# Patient Record
Sex: Female | Born: 1935 | Race: White | Hispanic: No | Marital: Married | Smoking: Never smoker
Health system: Southern US, Community
[De-identification: ages and names within clinical notes are randomized; demographics above are authoritative.]

## PROBLEM LIST (undated history)

## (undated) DIAGNOSIS — K219 Gastro-esophageal reflux disease without esophagitis: Secondary | ICD-10-CM

## (undated) DIAGNOSIS — C50919 Malignant neoplasm of unspecified site of unspecified female breast: Secondary | ICD-10-CM

## (undated) DIAGNOSIS — M1990 Unspecified osteoarthritis, unspecified site: Secondary | ICD-10-CM

## (undated) DIAGNOSIS — I10 Essential (primary) hypertension: Secondary | ICD-10-CM

## (undated) DIAGNOSIS — F419 Anxiety disorder, unspecified: Secondary | ICD-10-CM

## (undated) DIAGNOSIS — I639 Cerebral infarction, unspecified: Secondary | ICD-10-CM

## (undated) HISTORY — PX: APPENDECTOMY: SHX54

## (undated) HISTORY — DX: Cerebral infarction, unspecified: I63.9

## (undated) HISTORY — PX: BREAST SURGERY: SHX581

---

## 1988-01-19 HISTORY — PX: ANKLE FRACTURE SURGERY: SHX122

## 2004-01-19 DIAGNOSIS — C50919 Malignant neoplasm of unspecified site of unspecified female breast: Secondary | ICD-10-CM

## 2004-01-19 HISTORY — DX: Malignant neoplasm of unspecified site of unspecified female breast: C50.919

## 2004-04-18 HISTORY — PX: OTHER SURGICAL HISTORY: SHX169

## 2004-04-27 ENCOUNTER — Encounter (INDEPENDENT_AMBULATORY_CARE_PROVIDER_SITE_OTHER): Payer: Self-pay | Admitting: *Deleted

## 2004-04-27 ENCOUNTER — Encounter: Admission: RE | Admit: 2004-04-27 | Discharge: 2004-04-27 | Payer: Self-pay | Admitting: General Surgery

## 2004-04-27 ENCOUNTER — Encounter (INDEPENDENT_AMBULATORY_CARE_PROVIDER_SITE_OTHER): Payer: Self-pay | Admitting: Radiology

## 2004-05-01 ENCOUNTER — Encounter: Admission: RE | Admit: 2004-05-01 | Discharge: 2004-05-01 | Payer: Self-pay | Admitting: General Surgery

## 2004-05-05 ENCOUNTER — Encounter: Admission: RE | Admit: 2004-05-05 | Discharge: 2004-05-05 | Payer: Self-pay | Admitting: General Surgery

## 2004-05-07 ENCOUNTER — Encounter (INDEPENDENT_AMBULATORY_CARE_PROVIDER_SITE_OTHER): Payer: Self-pay | Admitting: *Deleted

## 2004-05-07 ENCOUNTER — Ambulatory Visit: Payer: Self-pay | Admitting: Oncology

## 2004-05-07 ENCOUNTER — Ambulatory Visit (HOSPITAL_COMMUNITY): Admission: RE | Admit: 2004-05-07 | Discharge: 2004-05-07 | Payer: Self-pay | Admitting: General Surgery

## 2004-05-07 ENCOUNTER — Ambulatory Visit (HOSPITAL_BASED_OUTPATIENT_CLINIC_OR_DEPARTMENT_OTHER): Admission: RE | Admit: 2004-05-07 | Discharge: 2004-05-07 | Payer: Self-pay | Admitting: General Surgery

## 2004-10-12 ENCOUNTER — Encounter: Admission: RE | Admit: 2004-10-12 | Discharge: 2004-10-12 | Payer: Self-pay | Admitting: General Surgery

## 2005-06-03 ENCOUNTER — Encounter: Admission: RE | Admit: 2005-06-03 | Discharge: 2005-06-03 | Payer: Self-pay | Admitting: General Surgery

## 2006-06-16 ENCOUNTER — Encounter: Admission: RE | Admit: 2006-06-16 | Discharge: 2006-06-16 | Payer: Self-pay | Admitting: General Surgery

## 2006-08-20 ENCOUNTER — Emergency Department (HOSPITAL_COMMUNITY): Admission: EM | Admit: 2006-08-20 | Discharge: 2006-08-20 | Payer: Self-pay | Admitting: Emergency Medicine

## 2007-06-26 ENCOUNTER — Encounter: Admission: RE | Admit: 2007-06-26 | Discharge: 2007-06-26 | Payer: Self-pay | Admitting: Surgery

## 2010-06-05 NOTE — Op Note (Signed)
NAME:  Erin Branch, Erin Branch                 ACCOUNT NO.:  [id number]   MEDICAL RECORD NO.:  [id number]          PATIENT TYPE:  AMB   LOCATION:  DSC                          FACILITY:  [facility]   PHYSICIAN:  Rose Phi. Maple Hudson, M.D.   DATE OF BIRTH:  08-30-1933   DATE OF PROCEDURE:  05/07/2004  DATE OF DISCHARGE:                                 OPERATIVE REPORT   PREOPERATIVE DIAGNOSIS:  Stage I carcinoma of the right breast.   POSTOPERATIVE DIAGNOSIS:  Stage I carcinoma of the right breast.   OPERATION:  1.  Blue dye injection.  2.  Right sentinel lymph node biopsy  3.  Right partial mastectomy.   SURGEON:  Francina Ames, M.D.   ANESTHESIA:  General.   OPERATIVE PROCEDURE:  Prior to coming to the operating room, 1 mCi of  technetium sulfur colloid was injected intradermally.   After suitable general anesthesia was induced, the patient was placed in  supine position with the arms extended on the arm boards and the right  breast and axilla prepped and draped in usual fashion.  Five milliliters of  a mixture of 2 mL of methylene blue and 3 mL of injectable saline were  injected into the subareolar breast tissue and then gently massaged.   After prepping and draping, a short transverse right axillary incision was  made with dissection through subcutaneous tissue to the clavipectoral  fascia.  Just deep to this fascia was a blue lymphatic clearly going into a  hot and blue lymph node, which we removed as the sentinel node.  There was 1  adjacent node that was enlarged, but probably with fat replacement.  There  were no other blue, hot or palpable nodes.   While that node was being evaluated by the pathologist, a curved incision  overlying the palpable tumor in the upper-outer quadrant of the right breast  was outlined and then an incision made including an ellipse of skin.  We  then did a wide excision of the palpable mass.  Specimen was oriented for  the pathologist and then submitted for margin  evaluation.   Touch preps of both the margins and the sentinel nodes were negative.  Both  incisions were injected with local the anesthetic mixture and then closed in  2 layers of 3-0 Vicryl and subcuticular 4-0 Monocryl with Steri-Strips.   Dressings were then applied and the patient transferred to the recovery room  in satisfactory condition, having tolerated the procedure well.      PRY/MEDQ  D:  05/07/2004  T:  05/08/2004  Job:  [id number]

## 2012-06-13 ENCOUNTER — Encounter (HOSPITAL_COMMUNITY): Payer: Self-pay | Admitting: *Deleted

## 2012-06-13 ENCOUNTER — Inpatient Hospital Stay (HOSPITAL_COMMUNITY)
Admission: EM | Admit: 2012-06-13 | Discharge: 2012-06-20 | DRG: 064 | Disposition: A | Discharge status: Home Health | Payer: Medicare Other | Attending: Internal Medicine | Admitting: Internal Medicine

## 2012-06-13 ENCOUNTER — Emergency Department (HOSPITAL_COMMUNITY): Payer: Medicare Other

## 2012-06-13 ENCOUNTER — Inpatient Hospital Stay (HOSPITAL_COMMUNITY): Payer: Medicare Other

## 2012-06-13 DIAGNOSIS — R339 Retention of urine, unspecified: Secondary | ICD-10-CM | POA: Diagnosis not present

## 2012-06-13 DIAGNOSIS — Z91041 Radiographic dye allergy status: Secondary | ICD-10-CM

## 2012-06-13 DIAGNOSIS — Z853 Personal history of malignant neoplasm of breast: Secondary | ICD-10-CM

## 2012-06-13 DIAGNOSIS — Z66 Do not resuscitate: Secondary | ICD-10-CM | POA: Diagnosis present

## 2012-06-13 DIAGNOSIS — I10 Essential (primary) hypertension: Secondary | ICD-10-CM | POA: Diagnosis present

## 2012-06-13 DIAGNOSIS — Z91199 Patient's noncompliance with other medical treatment and regimen due to unspecified reason: Secondary | ICD-10-CM

## 2012-06-13 DIAGNOSIS — R29810 Facial weakness: Secondary | ICD-10-CM | POA: Diagnosis present

## 2012-06-13 DIAGNOSIS — G8190 Hemiplegia, unspecified affecting unspecified side: Secondary | ICD-10-CM | POA: Diagnosis present

## 2012-06-13 DIAGNOSIS — R4701 Aphasia: Secondary | ICD-10-CM | POA: Diagnosis present

## 2012-06-13 DIAGNOSIS — Z9119 Patient's noncompliance with other medical treatment and regimen: Secondary | ICD-10-CM

## 2012-06-13 DIAGNOSIS — R197 Diarrhea, unspecified: Secondary | ICD-10-CM | POA: Diagnosis not present

## 2012-06-13 DIAGNOSIS — R1310 Dysphagia, unspecified: Secondary | ICD-10-CM | POA: Diagnosis present

## 2012-06-13 DIAGNOSIS — R279 Unspecified lack of coordination: Secondary | ICD-10-CM | POA: Diagnosis present

## 2012-06-13 DIAGNOSIS — E785 Hyperlipidemia, unspecified: Secondary | ICD-10-CM | POA: Diagnosis present

## 2012-06-13 DIAGNOSIS — R42 Dizziness and giddiness: Secondary | ICD-10-CM | POA: Diagnosis present

## 2012-06-13 DIAGNOSIS — G458 Other transient cerebral ischemic attacks and related syndromes: Secondary | ICD-10-CM | POA: Diagnosis not present

## 2012-06-13 DIAGNOSIS — I6340 Cerebral infarction due to embolism of unspecified cerebral artery: Secondary | ICD-10-CM | POA: Diagnosis present

## 2012-06-13 DIAGNOSIS — Q283 Other malformations of cerebral vessels: Secondary | ICD-10-CM

## 2012-06-13 DIAGNOSIS — I651 Occlusion and stenosis of basilar artery: Secondary | ICD-10-CM

## 2012-06-13 DIAGNOSIS — Z7902 Long term (current) use of antithrombotics/antiplatelets: Secondary | ICD-10-CM

## 2012-06-13 DIAGNOSIS — R2990 Unspecified symptoms and signs involving the nervous system: Secondary | ICD-10-CM

## 2012-06-13 DIAGNOSIS — E860 Dehydration: Secondary | ICD-10-CM | POA: Diagnosis present

## 2012-06-13 DIAGNOSIS — Z79899 Other long term (current) drug therapy: Secondary | ICD-10-CM

## 2012-06-13 DIAGNOSIS — R471 Dysarthria and anarthria: Secondary | ICD-10-CM | POA: Diagnosis present

## 2012-06-13 DIAGNOSIS — Z880 Allergy status to penicillin: Secondary | ICD-10-CM

## 2012-06-13 DIAGNOSIS — I639 Cerebral infarction, unspecified: Secondary | ICD-10-CM

## 2012-06-13 DIAGNOSIS — E876 Hypokalemia: Secondary | ICD-10-CM | POA: Diagnosis present

## 2012-06-13 DIAGNOSIS — H9190 Unspecified hearing loss, unspecified ear: Secondary | ICD-10-CM | POA: Diagnosis present

## 2012-06-13 DIAGNOSIS — Z8249 Family history of ischemic heart disease and other diseases of the circulatory system: Secondary | ICD-10-CM

## 2012-06-13 DIAGNOSIS — K219 Gastro-esophageal reflux disease without esophagitis: Secondary | ICD-10-CM | POA: Diagnosis present

## 2012-06-13 DIAGNOSIS — Z7982 Long term (current) use of aspirin: Secondary | ICD-10-CM

## 2012-06-13 HISTORY — DX: Malignant neoplasm of unspecified site of unspecified female breast: C50.919

## 2012-06-13 HISTORY — DX: Cerebral infarction, unspecified: I63.9

## 2012-06-13 HISTORY — DX: Essential (primary) hypertension: I10

## 2012-06-13 LAB — URINALYSIS, ROUTINE W REFLEX MICROSCOPIC
Glucose, UA: NEGATIVE mg/dL
Ketones, ur: NEGATIVE mg/dL
Protein, ur: NEGATIVE mg/dL
Urobilinogen, UA: 0.2 mg/dL (ref 0.0–1.0)
pH: 7.5 (ref 5.0–8.0)

## 2012-06-13 LAB — POCT I-STAT TROPONIN I: Troponin i, poc: 0 ng/mL (ref 0.00–0.08)

## 2012-06-13 LAB — POCT I-STAT, CHEM 8
BUN: 13 mg/dL (ref 6–23)
Calcium, Ion: 1.28 mmol/L (ref 1.13–1.30)
Chloride: 107 mEq/L (ref 96–112)
Glucose, Bld: 119 mg/dL — ABNORMAL HIGH (ref 70–99)
HCT: 34 % — ABNORMAL LOW (ref 36.0–46.0)
TCO2: 27 mmol/L (ref 0–100)

## 2012-06-13 LAB — CBC WITH DIFFERENTIAL/PLATELET
Basophils Absolute: 0.2 10*3/uL — ABNORMAL HIGH (ref 0.0–0.1)
Basophils Relative: 2 % — ABNORMAL HIGH (ref 0–1)
Eosinophils Relative: 0 % (ref 0–5)
Hemoglobin: 11.7 g/dL — ABNORMAL LOW (ref 12.0–15.0)
Lymphs Abs: 1 10*3/uL (ref 0.7–4.0)
MCV: 82.3 fL (ref 78.0–100.0)
Monocytes Absolute: 0.2 10*3/uL (ref 0.1–1.0)
Monocytes Relative: 3 % (ref 3–12)
RDW: 13.4 % (ref 11.5–15.5)
WBC: 6.4 10*3/uL (ref 4.0–10.5)

## 2012-06-13 LAB — AMMONIA: Ammonia: 20 umol/L (ref 11–60)

## 2012-06-13 LAB — GLUCOSE, CAPILLARY

## 2012-06-13 LAB — COMPREHENSIVE METABOLIC PANEL
AST: 16 U/L (ref 0–37)
Albumin: 3.2 g/dL — ABNORMAL LOW (ref 3.5–5.2)
Alkaline Phosphatase: 137 U/L — ABNORMAL HIGH (ref 39–117)
GFR calc Af Amer: 80 mL/min — ABNORMAL LOW (ref >90)
GFR calc non Af Amer: 69 mL/min — ABNORMAL LOW (ref >90)
Glucose, Bld: 119 mg/dL — ABNORMAL HIGH (ref 70–99)
Potassium: 4 mEq/L (ref 3.5–5.1)
Total Protein: 6.9 g/dL (ref 6.0–8.3)

## 2012-06-13 LAB — PROTIME-INR: INR: 1.03 (ref 0.00–1.49)

## 2012-06-13 MED ORDER — SODIUM CHLORIDE 0.9 % IJ SOLN
3.0000 mL | Freq: Two times a day (BID) | INTRAMUSCULAR | Status: DC
  Administered 2012-06-13 – 2012-06-19 (×8): 3 mL via INTRAVENOUS

## 2012-06-13 MED ORDER — SODIUM CHLORIDE 0.9 % IV SOLN: 250.0000 mL | INTRAVENOUS | Status: DC | PRN

## 2012-06-13 MED ORDER — SODIUM CHLORIDE 0.9 % IJ SOLN: 3.0000 mL | INTRAMUSCULAR | Status: DC | PRN

## 2012-06-13 MED ORDER — MECLIZINE HCL 25 MG PO TABS
25.0000 mg | ORAL_TABLET | Freq: Once | ORAL | Status: AC
  Administered 2012-06-13: 25 mg via ORAL
  Filled 2012-06-13: qty 1

## 2012-06-13 MED ORDER — ATORVASTATIN CALCIUM 80 MG PO TABS
80.0000 mg | ORAL_TABLET | Freq: Every day | ORAL | Status: DC
  Administered 2012-06-14 – 2012-06-19 (×6): 80 mg via ORAL
  Filled 2012-06-13 (×8): qty 1

## 2012-06-13 MED ORDER — LORAZEPAM 1 MG PO TABS
2.0000 mg | ORAL_TABLET | Freq: Once | ORAL | Status: AC
  Administered 2012-06-13: 2 mg via ORAL
  Filled 2012-06-13: qty 2

## 2012-06-13 MED ORDER — ENOXAPARIN SODIUM 40 MG/0.4ML ~~LOC~~ SOLN
40.0000 mg | SUBCUTANEOUS | Status: DC
  Administered 2012-06-13 – 2012-06-19 (×7): 40 mg via SUBCUTANEOUS
  Filled 2012-06-13 (×8): qty 0.4

## 2012-06-13 MED ORDER — ASPIRIN EC 81 MG PO TBEC
81.0000 mg | DELAYED_RELEASE_TABLET | Freq: Every day | ORAL | Status: DC
  Administered 2012-06-13 – 2012-06-20 (×8): 81 mg via ORAL
  Filled 2012-06-13 (×8): qty 1

## 2012-06-13 NOTE — ED Notes (Signed)
Per EMS pt from home with c/o dizziness, slurred speech and left side facial droop per family. Pt has had dizziness for three weeks. Today woke up with slurred speech and left sided facial droop. IV LFA 20G. Alert and oriented. No arm drift, grips equal. Given Zofran 4 mg IVP. CBG 118.

## 2012-06-13 NOTE — H&P (Signed)
Hospital Admission Note Date: 06/13/2012  Patient name: Erin Branch Medical record number: [id number] Date of birth: 1933-07-19 Age: 79 y.o. Gender: female PCP: Paulina Fusi, MD  Medical Service: Internal Medicine Teaching Service   Attending physician: Dr. Eben Burow  1st Contact: Dr. Sherrine Maples    Pager: [phone]  2nd Contact: Dr. Everardo Beals    Pager: [phone]  After 5 pm or weekends:  1st Contact:      Pager: [phone]  2nd Contact:      Pager: [phone]    Chief Complaint: Dysarthria, dizziness, and difficulty ambulating  History of Present Illness: 79yo F with PMH HTN, GERD, breast cancer s/p lumpectomy, and possible CHF present to the ED with dizziness, dysarthria, and difficulty ambulating.  Per pt, she has bee complaining of dizziness and congestion in her head intermittently over past week. Per her daughter, she had a fever 1 week ago and then began c/o of vertigo. She was also experiencing congestion in her head, which she attributed to allergies. Around the same time, she was having trouble walking and needed her husbands help ambulating. Per her daughter, she took motion sickness medication last night and was last seen "normal" around 9pm. This morning, per her husband, she woke up and her speech was off; he states that she seemed to have trouble finding her words, but her speech was also slurred. She attempted to walk to the bathroom but almost fell and was caught by her husband. He says the helped her to the bathroom and then put her back in bed. The son came over to the house and noticed a left sided facial droop, which had mostly resolved once she arrived to the ED.  Per the patient's daughters, she has a h/o HTN and is supposed to be on medication, but has not been taking any and has not needed to go to the doctor for anything.   She does also endorse diminished hearing from both ears. She denies confusion, difficulty swallowing, headaches, neck pain, changes in vision, or numbness,  weakness, or tingling in her bilateral upper and lower extremities.   Meds: Current Facility-Administered Medications  Medication Dose Route Frequency Provider Last Rate Last Dose  . aspirin EC tablet 81 mg  81 mg Oral Daily Wyatt Portela, MD   81 mg at 06/13/12 1328   Current Outpatient Prescriptions  Medication Sig Dispense Refill  . meclizine (ANTIVERT) 25 MG tablet Take 25 mg by mouth daily as needed for dizziness.      . valsartan (DIOVAN) 160 MG tablet Take 160 mg by mouth daily.        Allergies: Allergies as of 06/13/2012 - Review Complete 06/13/2012  Allergen Reaction Noted  . Contrast media (iodinated diagnostic agents) Nausea And Vomiting 06/13/2012  . Penicillins Itching and Swelling 06/13/2012   Past Medical History  Diagnosis Date  . Hypertension   . Carcinoma of breast, stage 1 2006    s/p Right partial mastectomy   Past Surgical History  Procedure Laterality Date  . Partial mastectomy Right 04/2004    Rose Phi. Maple Hudson, M.D.    Family History  Problem Relation Age of Onset  . Heart disease Mother    History   Social History  . Marital Status: Married    Spouse Name: N/A    Number of Children: N/A  . Years of Education: N/A   Occupational History  . Not on file.   Social History Main Topics  . Smoking status: Never Smoker   . Smokeless  tobacco: Not on file  . Alcohol Use: No  . Drug Use: No  . Sexually Active: Not on file   Other Topics Concern  . Not on file   Social History Narrative  . No narrative on file    Review of Systems: A 10 point ROS was performed; pertinent positives and negatives were noted in the HPI   Physical Exam: Blood pressure 182/65, pulse 60, temperature 98.4 F (36.9 C), temperature source Oral, resp. rate 16, SpO2 100.00%. General: Alert, well-developed, and cooperative on examination.  Head: Normocephalic and atraumatic.  Eyes: EOMI, Pupils equal, round, and reactive to light, no injection and anicteric.  Mouth:  Pharynx pink and moist, no erythema or exudates.  Neck: Supple, full ROM, no thyromegaly Lungs: CTAB, normal respiratory effort, no accessory muscle use, no crackles, and no wheezes. Heart: Regular rate, regular rhythm, no murmur, no gallop, and no rub.  Abdomen: Soft, non-tender, non-distended, normal bowel sounds, no guarding, no rebound tenderness, no organomegaly.  Msk: No joint swelling, warmth, or erythema.  Extremities: 2+ radial and DP pulses bilaterally. No cyanosis, clubbing, edema Neurologic: Alert & oriented X3, horizontal nystagmus when looking to the right or left. Speech is slurred, abnormal rapid finger movement with right hand, abnormal finer to nose with the left hand, difficulty with rapid hand movement exercise, L>R. Mild facial droop at corner of lip on right side (normal on the left), diminished hearing, requesting repetition of questions, otherwise cranial nerves appear intact Skin: Turgor normal and no rashes.  Psych: Normal mood and affect. Memory intact for recent and remote, normally interactive, good eye contact, not anxious appearing, and not depressed appearing.   Lab results: Basic Metabolic Panel:  Recent Labs  60/45/40 0940 06/13/12 0951  NA 139 141  K 4.0 4.0  CL 103 107  CO2 24  --   GLUCOSE 119* 119*  BUN 13 13  CREATININE 0.80 0.90  CALCIUM 10.1  --    Liver Function Tests:  Recent Labs  06/13/12 0940  AST 16  ALT 11  ALKPHOS 137*  BILITOT 0.2*  PROT 6.9  ALBUMIN 3.2*    Recent Labs  06/13/12 1045  AMMONIA 20   CBC:  Recent Labs  06/13/12 0940 06/13/12 0951  WBC 6.4  --   NEUTROABS 5.1  --   HGB 11.7* 11.6*  HCT 34.5* 34.0*  MCV 82.3  --   PLT 270  --    Cardiac Enzymes:  Recent Labs  06/13/12 0940  TROPONINI <0.30   CBG:  Recent Labs  06/13/12 0853  GLUCAP 123*   Coagulation:  Recent Labs  06/13/12 0940  LABPROT 13.4  INR 1.03   Urinalysis:  Recent Labs  06/13/12 0925  COLORURINE YELLOW   LABSPEC 1.009  PHURINE 7.5  GLUCOSEU NEGATIVE  HGBUR NEGATIVE  BILIRUBINUR NEGATIVE  KETONESUR NEGATIVE  PROTEINUR NEGATIVE  UROBILINOGEN 0.2  NITRITE NEGATIVE  LEUKOCYTESUR NEGATIVE    Imaging results:  Dg Chest 2 View  06/13/2012   *RADIOLOGY REPORT*  Clinical Data: Dizziness.  CHEST - 2 VIEW  Comparison: 05/05/2004  Findings: Mild hyperinflation of the lungs.  Linear scarring or atelectasis at the left base.  Right lung is clear.  No effusions or acute bony abnormality.  IMPRESSION: Left basilar scarring or atelectasis.  Mild hyperinflation.   Original Report Authenticated By: Charlett Nose, M.D.   Ct Head Wo Contrast  06/13/2012   *RADIOLOGY REPORT*  Clinical Data: Dizziness, slurred speech, left facial droop  CT HEAD  WITHOUT CONTRAST  Technique:  Contiguous axial images were obtained from the base of the skull through the vertex without contrast.  Comparison: None.  Findings: No evidence of parenchymal hemorrhage or extra-axial fluid collection. No mass lesion, mass effect, or midline shift.  No CT evidence of acute infarction.  Mild subcortical white matter and periventricular small vessel ischemic changes.  Intracranial atherosclerosis.  Cerebral volume is age appropriate.  No ventriculomegaly.  The visualized paranasal sinuses are essentially clear. The mastoid air cells are unopacified.  No evidence of calvarial fracture.  IMPRESSION: No evidence of acute intracranial abnormality.   Original Report Authenticated By: Charline Bills, M.D.    Other results: EKG: Normal sinus rhythm, rate 62, normal axis, QTc 467, nonspecific T-wave changes. No prior EKG.  Assessment & Plan by Problem: 79yo F with PMH HTN, GERD, breast cancer s/p lumpectomy, and possible CHF present to the ED with dizziness, dysarthria, and difficulty ambulating.  Likely posterior circulation stroke: Symptom onset possibly began over the past week with significant acute changes likely occurring overnight including  dysarthria, decreased hearing, and ataxia- although she reports feeling unsteady on her feet over the past week. Her symptoms seem more consistent with a posterior circulation stroke, possibly involving the AICA. Neurology was consulted and recommended an MRI, which is still pending. With her history of uncontrolled HTN, she has likely had a CVA 2/2 hypertension, with BP recorded as high as 199/66 on admission.  - Admit to IMTS to tele - Neuro checks q2 hrs x12hrs, then q4h - Vital signs q2 hrs x12hrs, then q4h - Lipid panel - ASA 81mg  po daily - PT/OT eval - SLP swallow - 2D ECHO - Carotid dopplers - F/u with Neurology, appreciate recommendations  HTN: Chronic HTN, not taking medication but supposed to be on Diovan. Will allow for permissive HTN in the setting of her presumed CVA. - Allow for permissive HTN in setting of probable acute CVA  DVT PPx: Lovenox, SCDs  Dispo: Disposition is deferred at this time, awaiting improvement of current medical problems. Anticipated discharge in approximately 1-3 day(s).   The patient does have a current PCP (SCHULTZ,DOUGLAS E, MD), therefore will not be requiring OPC follow-up after discharge.   The patient does not have transportation limitations that hinder transportation to clinic appointments.  Signed: Genelle Gather 06/13/2012, 3:52 PM

## 2012-06-13 NOTE — ED Notes (Signed)
Attempted to call report

## 2012-06-13 NOTE — H&P (Signed)
[facility] Ad[facility] Note Date: 06/13/2012  Patient name: Erin Branch Medical record number: [id number] Date of birth: [birth year] Age: 79 y.o. Gender: female PCP: Paulina Fusi, MD  Medical Service: Internal Medicine (B1)  Attending physician: Dr. Criselda Peaches     Chief Complaint: vertigo, ataxia, slurred speech, and hearing loss   History of Present Illness: Erin Branch is a 79 yo F with a h/o HTN and CHF presenting with new onset ataxia, slurred speech, and hearing loss.  Her husband noticed these symptoms when she woke up this morning.  She was unable to walk to the restroom and had slurred speech.  Prior to this AM, her family says she was able to hear well.  She was last seen by her family without these symptoms at 1 pm yesterday (06/13/11) and is therefore outside of the code stroke window. Over the past week, she has been c/o dizziness and feeling as though the room is spinning, for which she has taken anti-motion sickness medication; she denies falling.  Her husband believes her vertigo is related to the mulch used on their neighbor's farm.  She was hypertensive upon arrival in the ED  (199/66) and reports that she does not take her anti-hypertensives and generally avoids seeing doctors.  Her speech is difficult to understand and most relevant history is provided by bedside family members.  Her head CT did not so an acute intracranial abnormality and an MRI could not be obtained as the machine was not functioning.    Meds: Current Outpatient Rx  Name  Route  Sig  Dispense  Refill  . meclizine (ANTIVERT) 25 MG tablet   Oral   Take 25 mg by mouth daily as needed for dizziness.         . valsartan (DIOVAN) 160 MG tablet   Oral   Take 160 mg by mouth daily.           Allergies: Allergies as of 06/13/2012 - Review Complete 06/13/2012  Allergen Reaction Noted  . Contrast media (iodinated diagnostic agents) Nausea And Vomiting 06/13/2012  . Penicillins Itching and  Swelling 06/13/2012   Past Medical History  Diagnosis Date  . Hypertension   . Carcinoma of breast, stage 1 2006    s/p Right partial mastectomy   Past Surgical History  Procedure Laterality Date  . Partial mastectomy Right 04/2004    Rose Phi. Maple Hudson, M.D.    Family History  Problem Relation Age of Onset  . Heart disease Mother    History   Social History  . Marital Status: Married    Spouse Name: N/A    Number of Children: N/A  . Years of Education: N/A   Occupational History  . Not on file.   Social History Main Topics  . Smoking status: Never Smoker   . Smokeless tobacco: Not on file  . Alcohol Use: No  . Drug Use: No  . Sexually Active: Not on file   Other Topics Concern  . Not on file   Social History Narrative  . No narrative on file    Review of Systems: A comprehensive review of systems was negative except for: except per HPI.  Physical Exam: Blood pressure 182/65, pulse 60, temperature 98.4 F (36.9 C), temperature source Oral, resp. rate 16, SpO2 100.00%. General appearance: alert, cooperative and appears older than stated age Head: Normocephalic, without obvious abnormality, atraumatic, sinuses nontender to percussion Eyes: PERRL. EOMI with horizontal nystagmus when looking right.  Ears: normal TM's and  external ear canals both ears Nose: Nares normal.  Mucosa edematous, Naschitti in place. Throat: MMM Lungs: clear to auscultation bilaterally Heart: regular rate and rhythm, S1, S2 normal, no murmur, click, rub or gallop Abdomen: soft, non-tender; bowel sounds normal; no masses,  no organomegaly Extremities: extremities normal, atraumatic, no cyanosis or edema Pulses: 2+ and symmetric Neurologic: Slurred speech.  Requires raised voice to hear questions. Grade II House-Brackmann weakness in R marginal mandibular branch of facial nerve.  unable to visualize uvula.  Otherwise CN II-XII intact.  Coughing spell after taking PO medications with sips of water.   Strength 5/5 x 4. Slower R rapid-hand-movement on R versus L.  Normal finger-to-nose and heel-to-shin.  Gait was not examined.   Lab results: Basic Metabolic Panel:  Recent Labs  16/10/96 0940 06/13/12 0951  NA 139 141  K 4.0 4.0  CL 103 107  CO2 24  --   GLUCOSE 119* 119*  BUN 13 13  CREATININE 0.80 0.90  CALCIUM 10.1  --    Liver Function Tests:  Recent Labs  06/13/12 0940  AST 16  ALT 11  ALKPHOS 137*  BILITOT 0.2*  PROT 6.9  ALBUMIN 3.2*   No results found for this basename: LIPASE, AMYLASE,  in the last 72 hours  Recent Labs  06/13/12 1045  AMMONIA 20   CBC:  Recent Labs  06/13/12 0940 06/13/12 0951  WBC 6.4  --   NEUTROABS 5.1  --   HGB 11.7* 11.6*  HCT 34.5* 34.0*  MCV 82.3  --   PLT 270  --    Cardiac Enzymes:  Recent Labs  06/13/12 0940  TROPONINI <0.30   BNP: No results found for this basename: PROBNP,  in the last 72 hours D-Dimer: No results found for this basename: DDIMER,  in the last 72 hours CBG:  Recent Labs  06/13/12 0853  GLUCAP 123*   Hemoglobin A1C: No results found for this basename: HGBA1C,  in the last 72 hours Fasting Lipid Panel: No results found for this basename: CHOL, HDL, LDLCALC, TRIG, CHOLHDL, LDLDIRECT,  in the last 72 hours Thyroid Function Tests: No results found for this basename: TSH, T4TOTAL, FREET4, T3FREE, THYROIDAB,  in the last 72 hours Anemia Panel: No results found for this basename: VITAMINB12, FOLATE, FERRITIN, TIBC, IRON, RETICCTPCT,  in the last 72 hours Coagulation:  Recent Labs  06/13/12 0940  LABPROT 13.4  INR 1.03   Urine Drug Screen: Drugs of Abuse  No results found for this basename: labopia, cocainscrnur, labbenz, amphetmu, thcu, labbarb    Alcohol Level: No results found for this basename: ETH,  in the last 72 hours Urinalysis:  Recent Labs  06/13/12 0925  COLORURINE YELLOW  LABSPEC 1.009  PHURINE 7.5  GLUCOSEU NEGATIVE  HGBUR NEGATIVE  BILIRUBINUR NEGATIVE   KETONESUR NEGATIVE  PROTEINUR NEGATIVE  UROBILINOGEN 0.2  NITRITE NEGATIVE  LEUKOCYTESUR NEGATIVE     Imaging results:  Dg Chest 2 View  06/13/2012   *RADIOLOGY REPORT*  Clinical Data: Dizziness.  CHEST - 2 VIEW  Comparison: 05/05/2004  Findings: Mild hyperinflation of the lungs.  Linear scarring or atelectasis at the left base.  Right lung is clear.  No effusions or acute bony abnormality.  IMPRESSION: Left basilar scarring or atelectasis.  Mild hyperinflation.   Original Report Authenticated By: Charlett Nose, M.D.   Ct Head Wo Contrast  06/13/2012   *RADIOLOGY REPORT*  Clinical Data: Dizziness, slurred speech, left facial droop  CT HEAD WITHOUT CONTRAST  Technique:  Contiguous axial images were obtained from the base of the skull through the vertex without contrast.  Comparison: None.  Findings: No evidence of parenchymal hemorrhage or extra-axial fluid collection. No mass lesion, mass effect, or midline shift.  No CT evidence of acute infarction.  Mild subcortical white matter and periventricular small vessel ischemic changes.  Intracranial atherosclerosis.  Cerebral volume is age appropriate.  No ventriculomegaly.  The visualized paranasal sinuses are essentially clear. The mastoid air cells are unopacified.  No evidence of calvarial fracture.  IMPRESSION: No evidence of acute intracranial abnormality.   Original Report Authenticated By: Charline Bills, M.D.    Other results: EKG: normal EKG, normal sinus rhythm, nonspecific ST and T waves changes, consider anteroseptal infarct.  Assessment & Plan by Problem: 79 yo F with a h/o HTN and CHF with CVA, likely involving posterior circulation.  Active Problems:   * No active hospital problems. *  CVA -Admit to internal medicine (B1). -Etiology likely related to uncontrolled HTN. Presented outside of code stroke treatment window. -Neuro checks q2h. -Stroke work-up: HgA1C, fasting lipid panel, echo, carotid dopplers. -MRI and MRA of  brain when machine is functional. -On 2 L [place].  Wean as tolerated with goal SpO2 >92% -NPO given coughing spell after sips of water. Speech consult with swallow eval. -OT/PT. -ASA 81 mg daily. -Cardiac monitoring. -Neuro following.  Appreciate recs. -CBC in AM.  HTN -HDS. -Permissive HTN with goal systolic <220. -Restart ARB tomorrow.  CHF -Stable, no evidence of fluid overload. -Echo tomorrow.  Prophylaxis: lovenox 40 mg q24h  Access: L forearm PIV (NS 5/27)  Code: DNR  Dispo: floor status -Anticipated date of discharge: 1-3 days  This is a Psychologist, occupational Note.  The care of the patient was discussed with Dr. Dorma Russell and the assessment and plan was formulated with their assistance.  Please see their note for official documentation of the patient encounter.   Signed: Derwood Kaplan 06/13/2012, 3:48 PM

## 2012-06-13 NOTE — ED Provider Notes (Signed)
History     CSN: [id number]  Arrival date & time 06/13/12  0825   First MD Initiated Contact with Patient 06/13/12 [phone]      Chief Complaint  Patient presents with  . Dizziness  . Aphasia    HPI 79 year old female with history of hypertension and breast cancer remotely presents with strokelike symptoms.  Family reports that the patient has been ill for approximately 4 days to one week. For the past several days she has complained of dizziness, and family reports that she's been taking medicine for vertigo. They're not able to tell exactly what medicine she's been taking. She's been reporting that she is dizzy with a sensation of movement. Her gait is at times been unsteady. However they report that for the past several days she's been caring for self at home, walking normally, had normal speech, and has been relatively functional at her baseline. However this morning upon awakening the patient was unable to get out of bed. Her speech was noted by her husband to be abnormal. He states that she was not fully able to comprehend things he was saying to her, and he was unable to comprehend what she was saying because her speech seemed slurred. The patient reports to me that she is unable to say which is trying to say, unable to find words properly. The patient was last seen normal about 9 PM last night when she went to bed. Her husband and her daughter report that she was definitely abnormal when she awoke this morning.  She reports that she is dizzy. She describes a sensation that the room is spinning, and at that couches try to move away from her when she tries to sit down or sit on the couch. She states she is unable to walk. She says that yesterday she is able to walk normally. This morning she's had nausea and has vomited in route via EMS. Chills reports that she's had decreased hearing in both ears. She denies headache, neck pain, changes in her vision, weakness in her arms and legs, loss of  sensation, numbness or tingling.  She has no history of stroke, heart disease, arrhythmia, congestive heart failure, diabetes, or any kidney disease.   Past Medical History  Diagnosis Date  . Hypertension   . Carcinoma of breast, stage 1 2006    s/p Right partial mastectomy    Past Surgical History  Procedure Laterality Date  . Partial mastectomy Right 04/2004    Rose Phi. Maple Hudson, M.D.     Family History  Problem Relation Age of Onset  . Heart disease Mother     History  Substance Use Topics  . Smoking status: Never Smoker   . Smokeless tobacco: Not on file  . Alcohol Use: No    OB History   Grav Para Term Preterm Abortions TAB SAB Ect Mult Living                  Review of Systems  Constitutional: Negative for fever, chills and diaphoresis.  HENT: Positive for hearing loss. Negative for congestion, rhinorrhea, neck pain and neck stiffness.   Respiratory: Negative for cough, shortness of breath and wheezing.   Cardiovascular: Negative for chest pain and leg swelling.  Gastrointestinal: Negative for nausea, vomiting, abdominal pain and diarrhea.  Genitourinary: Negative for dysuria, urgency, frequency, flank pain, vaginal bleeding, vaginal discharge and difficulty urinating.  Skin: Negative for rash.  Neurological: Positive for dizziness and speech difficulty. Negative for seizures, syncope, weakness, numbness  and headaches.  All other systems reviewed and are negative.    Allergies  Contrast media and Penicillins  Home Medications   Current Outpatient Rx  Name  Route  Sig  Dispense  Refill  . meclizine (ANTIVERT) 25 MG tablet   Oral   Take 25 mg by mouth daily as needed for dizziness.         . valsartan (DIOVAN) 160 MG tablet   Oral   Take 160 mg by mouth daily.           BP 182/65  Pulse 60  Temp(Src) 98.4 F (36.9 C) (Oral)  Resp 16  SpO2 100%  Physical Exam  Nursing note and vitals reviewed. Constitutional: She is oriented to person,  place, and time. She appears well-developed and well-nourished. No distress.  Patient is tearful, somnolent.  HENT:  Head: Normocephalic and atraumatic.  Mouth/Throat: Oropharynx is clear and moist.  Eyes: Conjunctivae and EOM are normal. Pupils are equal, round, and reactive to light. No scleral icterus.  Right beat nystagmus. Horizontal nystagmus only. No rotary or vertical nystagmus.  Neck: Normal range of motion. Neck supple. No JVD present.  Cardiovascular: Normal rate, regular rhythm, normal heart sounds and intact distal pulses.  Exam reveals no gallop and no friction rub.   No murmur heard. Pulmonary/Chest: Effort normal. No accessory muscle usage. No respiratory distress. She has no decreased breath sounds. She has no wheezes. She has rhonchi (diffuse throughout both lung fields.). She has no rales.  Abdominal: Soft. Bowel sounds are normal. She exhibits no distension. There is no tenderness. There is no rebound and no guarding.  Musculoskeletal: She exhibits no edema.  Neurological: She is alert and oriented to person, place, and time. No cranial nerve deficit. She exhibits normal muscle tone. Coordination normal. GCS eye subscore is 4. GCS verbal subscore is 5. GCS motor subscore is 6.  Cranial nerves II through XII intact. Rightward nystagmus.  ataxic gait. Normal finger to nose and heel to shin. She is unable to keep her balance with standing and she is very ataxic gait. Visual fields intact to confrontation. 2+ bilateral patellar and 2+ bilateral biceps reflexes. Sensation grossly intact to light touch throughout.  Skin: Skin is warm and dry. She is not diaphoretic.    ED Course  Procedures (including critical care time)  Labs Reviewed  CBC WITH DIFFERENTIAL - Abnormal; Notable for the following:    Hemoglobin 11.7 (*)    HCT 34.5 (*)    Neutrophils Relative % 79 (*)    Basophils Relative 2 (*)    Basophils Absolute 0.2 (*)    All other components within normal limits   URINALYSIS, ROUTINE W REFLEX MICROSCOPIC - Abnormal; Notable for the following:    APPearance CLOUDY (*)    All other components within normal limits  COMPREHENSIVE METABOLIC PANEL - Abnormal; Notable for the following:    Glucose, Bld 119 (*)    Albumin 3.2 (*)    Alkaline Phosphatase 137 (*)    Total Bilirubin 0.2 (*)    GFR calc non Af Amer 69 (*)    GFR calc Af Amer 80 (*)    All other components within normal limits  GLUCOSE, CAPILLARY - Abnormal; Notable for the following:    Glucose-Capillary 123 (*)    All other components within normal limits  POCT I-STAT, CHEM 8 - Abnormal; Notable for the following:    Glucose, Bld 119 (*)    Hemoglobin 11.6 (*)  HCT 34.0 (*)    All other components within normal limits  PROTIME-INR  APTT  TROPONIN I  AMMONIA  CBC  DIFFERENTIAL  POCT I-STAT TROPONIN I   Dg Chest 2 View  06/13/2012   *RADIOLOGY REPORT*  Clinical Data: Dizziness.  CHEST - 2 VIEW  Comparison: 05/05/2004  Findings: Mild hyperinflation of the lungs.  Linear scarring or atelectasis at the left base.  Right lung is clear.  No effusions or acute bony abnormality.  IMPRESSION: Left basilar scarring or atelectasis.  Mild hyperinflation.   Original Report Authenticated By: Charlett Nose, M.D.   Ct Head Wo Contrast  06/13/2012   *RADIOLOGY REPORT*  Clinical Data: Dizziness, slurred speech, left facial droop  CT HEAD WITHOUT CONTRAST  Technique:  Contiguous axial images were obtained from the base of the skull through the vertex without contrast.  Comparison: None.  Findings: No evidence of parenchymal hemorrhage or extra-axial fluid collection. No mass lesion, mass effect, or midline shift.  No CT evidence of acute infarction.  Mild subcortical white matter and periventricular small vessel ischemic changes.  Intracranial atherosclerosis.  Cerebral volume is age appropriate.  No ventriculomegaly.  The visualized paranasal sinuses are essentially clear. The mastoid air cells are  unopacified.  No evidence of calvarial fracture.  IMPRESSION: No evidence of acute intracranial abnormality.   Original Report Authenticated By: Charline Bills, M.D.     1. Stroke-like symptom   2. Vertigo       MDM  79 year old female presents with several days of vertigo. Awoke this morning with dysarthric speech, unable to stand or walk, nausea, vomiting, family felt like she had a left-sided facial droop.  Hypertensive to 199/66. Vitals otherwise within normal limits. She is satting 93% on 2 L by nasal cannula. She is tearful. Somnolent. She has right beat nystagmus. Ataxic gait. Dysarthric speech. She has rhonchi throughout bilateral lung fields.  CT head without contrast demonstrates no acute intracranial abnormality. Chest x-ray demonstrates no acute cardiopulmonary disease. Hemoglobin is 11.7, glucose is 119, otherwise CBC, CMP, coags, troponin are all essentially normal.  Her constellation of symptoms are concerning for a posterior circulation stroke. She is admitted to internal medicine for further workup and evaluation to include an MRI which I ordered. She had a stable emergency department course.        Toney Sang, MD 06/13/12 1645

## 2012-06-13 NOTE — ED Notes (Signed)
CBG 123 

## 2012-06-13 NOTE — ED Notes (Signed)
Patient transported to CT 

## 2012-06-13 NOTE — Consult Note (Signed)
Referring Physician: ED    Chief Complaint: ataxia, vertigo, decreased hearing, and left face weakness.  HPI:                                                                                                                                         Erin Branch is an 79 y.o. female, right handed, with a past medical history significant for hypertension, remote breast cancer, brought to [facility] ED due to new onset unsteadiness, vertigo, impaired hearing, and left face droopiness. Never had similar symptoms before, but 3 days ago developed sudden onset vertigo and decreased hearing and when she woke up this morning she was severely off balance to the degree that needed help from her husband in order to walk and avoid falling. Additionally, she was noted to have slurred speech and some droopiness of the left face. Denies headache, double vision, difficulty swallowing, focal weakness or numbness, confusion. Family reports a recent upper respiratory infection. Upon arrival to ED she had a CT brain that did not show acute intracranial abnormality.    Date last known well: 3 days ago  Time last known well: uncertain. tPA Given: no, late presentation.  Past Medical History  Diagnosis Date  . Hypertension   . Cancer     No past surgical history on file.  No family history on file. Social History:  reports that she has never smoked. She does not have any smokeless tobacco history on file. She reports that she does not drink alcohol or use illicit drugs.  Allergies:  Allergies  Allergen Reactions  . Contrast Media (Iodinated Diagnostic Agents) Nausea And Vomiting  . Penicillins Itching and Swelling    Medications:                                                                                                                           I have reviewed the patient's current medications.  ROS:  History obtained from the patient, family, and chart review.  General ROS: negative for - chills, fatigue, fever, night sweats, weight gain or weight loss Psychological ROS: negative for - behavioral disorder, hallucinations, memory difficulties, mood swings or suicidal ideation Ophthalmic ROS: negative for - blurry vision, double vision, eye pain or loss of vision ENT ROS: negative for - epistaxis, nasal discharge, oral lesions, sore throat, tinnitus or vertigo Allergy and Immunology ROS: negative for - hives or itchy/watery eyes Hematological and Lymphatic ROS: negative for - bleeding problems, bruising or swollen lymph nodes Endocrine ROS: negative for - galactorrhea, hair pattern changes, polydipsia/polyuria or temperature intolerance Respiratory ROS: negative for - cough, hemoptysis, shortness of breath or wheezing Cardiovascular ROS: negative for - chest pain, dyspnea on exertion, edema or irregular heartbeat Gastrointestinal ROS: negative for - abdominal pain, diarrhea, hematemesis, nausea/vomiting or stool incontinence Genito-Urinary ROS: negative for - dysuria, hematuria, incontinence or urinary frequency/urgency Musculoskeletal ROS: negative for - joint swelling or muscular weakness Neurological ROS: as noted in HPI Dermatological ROS: negative for rash and skin lesion changes    Physical exam: pleasant female in no apparent distress. Blood pressure 176/80, pulse 68, temperature 98.1 F (36.7 C), temperature source Oral, resp. rate 19, SpO2 97.00%. Head: normocephalic. Neck: supple, no bruits, no JVD. Cardiac: no murmurs. Lungs: clear. Abdomen: soft, no tender, no mass. Extremities: no edema.   Neurologic Examination:                                                                                                      Mental Status: Alert, awake, oriented x 4, thought content appropriate.  Comprehension, naming, and repetition intact. Dysarthric but  no aphasia. Cranial Nerves: II: Discs flat bilaterally; Visual fields grossly normal, pupils equal, round, reactive to light and accommodation III,IV, VI: ptosis not present, extra-ocular motions intact bilaterally V:facial light touch sensation normal bilaterally [id number] left face weakness. VIII: impaired hearing bilaterally IX,X: gag reflex present XI: bilateral shoulder shrug XII: midline tongue extension Motor: Right : Upper extremity   5/5    Left:     Upper extremity   5/5  Lower extremity   5/5     Lower extremity   5/5 Tone and bulk:normal tone throughout; no atrophy noted Sensory: Pinprick and light touch intact throughout, bilaterally Deep Tendon Reflexes: 2+ and symmetric throughout Plantars: Right: downgoing   Left: downgoing Cerebellar: normal finger-to-nose,  normal heel-to-shin test Gait:  Can not stand up and walk by herself due to ataxia. CV: pulses palpable throughout      Results for orders placed during the hospital encounter of 06/13/12 (from the past 48 hour(s))  GLUCOSE, CAPILLARY     Status: Abnormal   Collection Time    06/13/12  8:53 AM      Result Value Range   Glucose-Capillary 123 (*) 70 - 99 mg/dL   Comment 1 Documented in Chart     Comment 2 Notify RN    URINALYSIS, ROUTINE W REFLEX MICROSCOPIC     Status: Abnormal   Collection Time    06/13/12  9:25 AM  Result Value Range   Color, Urine YELLOW  YELLOW   APPearance CLOUDY (*) CLEAR   Specific Gravity, Urine 1.009  1.005 - 1.030   pH 7.5  5.0 - 8.0   Glucose, UA NEGATIVE  NEGATIVE mg/dL   Hgb urine dipstick NEGATIVE  NEGATIVE   Bilirubin Urine NEGATIVE  NEGATIVE   Ketones, ur NEGATIVE  NEGATIVE mg/dL   Protein, ur NEGATIVE  NEGATIVE mg/dL   Urobilinogen, UA 0.2  0.0 - 1.0 mg/dL   Nitrite NEGATIVE  NEGATIVE   Leukocytes, UA NEGATIVE  NEGATIVE   Comment: MICROSCOPIC NOT DONE ON URINES WITH NEGATIVE PROTEIN, BLOOD, LEUKOCYTES, NITRITE, OR GLUCOSE <1000 mg/dL.  CBC WITH DIFFERENTIAL      Status: Abnormal   Collection Time    06/13/12  9:40 AM      Result Value Range   WBC 6.4  4.0 - 10.5 K/uL   RBC 4.19  3.87 - 5.11 MIL/uL   Hemoglobin 11.7 (*) 12.0 - 15.0 g/dL   HCT 40.9 (*) 81.1 - 91.4 %   MCV 82.3  78.0 - 100.0 fL   MCH 27.9  26.0 - 34.0 pg   MCHC 33.9  30.0 - 36.0 g/dL   RDW 78.2  95.6 - 21.3 %   Platelets 270  150 - 400 K/uL   Neutrophils Relative % 79 (*) 43 - 77 %   Neutro Abs 5.1  1.7 - 7.7 K/uL   Lymphocytes Relative 16  12 - 46 %   Lymphs Abs 1.0  0.7 - 4.0 K/uL   Monocytes Relative 3  3 - 12 %   Monocytes Absolute 0.2  0.1 - 1.0 K/uL   Eosinophils Relative 0  0 - 5 %   Eosinophils Absolute 0.0  0.0 - 0.7 K/uL   Basophils Relative 2 (*) 0 - 1 %   Basophils Absolute 0.2 (*) 0.0 - 0.1 K/uL  PROTIME-INR     Status: None   Collection Time    06/13/12  9:40 AM      Result Value Range   Prothrombin Time 13.4  11.6 - 15.2 seconds   INR 1.03  0.00 - 1.49  APTT     Status: None   Collection Time    06/13/12  9:40 AM      Result Value Range   aPTT 29  24 - 37 seconds  COMPREHENSIVE METABOLIC PANEL     Status: Abnormal   Collection Time    06/13/12  9:40 AM      Result Value Range   Sodium 139  135 - 145 mEq/L   Potassium 4.0  3.5 - 5.1 mEq/L   Chloride 103  96 - 112 mEq/L   CO2 24  19 - 32 mEq/L   Glucose, Bld 119 (*) 70 - 99 mg/dL   BUN 13  6 - 23 mg/dL   Creatinine, Ser 0.86  0.50 - 1.10 mg/dL   Calcium 57.8  8.4 - 46.9 mg/dL   Total Protein 6.9  6.0 - 8.3 g/dL   Albumin 3.2 (*) 3.5 - 5.2 g/dL   AST 16  0 - 37 U/L   ALT 11  0 - 35 U/L   Alkaline Phosphatase 137 (*) 39 - 117 U/L   Total Bilirubin 0.2 (*) 0.3 - 1.2 mg/dL   GFR calc non Af Amer 69 (*) >90 mL/min   GFR calc Af Amer 80 (*) >90 mL/min   Comment:  The eGFR has been calculated     using the CKD EPI equation.     This calculation has not been     validated in all clinical     situations.     eGFR's persistently     <90 mL/min signify     possible Chronic Kidney  Disease.  TROPONIN I     Status: None   Collection Time    06/13/12  9:40 AM      Result Value Range   Troponin I <0.30  <0.30 ng/mL   Comment:            Due to the release kinetics of cTnI,     a negative result within the first hours     of the onset of symptoms does not rule out     myocardial infarction with certainty.     If myocardial infarction is still suspected,     repeat the test at appropriate intervals.  POCT I-STAT TROPONIN I     Status: None   Collection Time    06/13/12  9:49 AM      Result Value Range   Troponin i, poc 0.00  0.00 - 0.08 ng/mL   Comment 3            Comment: Due to the release kinetics of cTnI,     a negative result within the first hours     of the onset of symptoms does not rule out     myocardial infarction with certainty.     If myocardial infarction is still suspected,     repeat the test at appropriate intervals.  POCT I-STAT, CHEM 8     Status: Abnormal   Collection Time    06/13/12  9:51 AM      Result Value Range   Sodium 141  135 - 145 mEq/L   Potassium 4.0  3.5 - 5.1 mEq/L   Chloride 107  96 - 112 mEq/L   BUN 13  6 - 23 mg/dL   Creatinine, Ser 1.61  0.50 - 1.10 mg/dL   Glucose, Bld 096 (*) 70 - 99 mg/dL   Calcium, Ion 0.45  4.09 - 1.30 mmol/L   TCO2 27  0 - 100 mmol/L   Hemoglobin 11.6 (*) 12.0 - 15.0 g/dL   HCT 81.1 (*) 91.4 - 78.2 %  AMMONIA     Status: None   Collection Time    06/13/12 10:45 AM      Result Value Range   Ammonia 20  11 - 60 umol/L   Dg Chest 2 View  06/13/2012   *RADIOLOGY REPORT*  Clinical Data: Dizziness.  CHEST - 2 VIEW  Comparison: 05/05/2004  Findings: Mild hyperinflation of the lungs.  Linear scarring or atelectasis at the left base.  Right lung is clear.  No effusions or acute bony abnormality.  IMPRESSION: Left basilar scarring or atelectasis.  Mild hyperinflation.   Original Report Authenticated By: Charlett Nose, M.D.   Ct Head Wo Contrast  06/13/2012   *RADIOLOGY REPORT*  Clinical Data:  Dizziness, slurred speech, left facial droop  CT HEAD WITHOUT CONTRAST  Technique:  Contiguous axial images were obtained from the base of the skull through the vertex without contrast.  Comparison: None.  Findings: No evidence of parenchymal hemorrhage or extra-axial fluid collection. No mass lesion, mass effect, or midline shift.  No CT evidence of acute infarction.  Mild subcortical white matter and periventricular small vessel ischemic changes.  Intracranial  atherosclerosis.  Cerebral volume is age appropriate.  No ventriculomegaly.  The visualized paranasal sinuses are essentially clear. The mastoid air cells are unopacified.  No evidence of calvarial fracture.  IMPRESSION: No evidence of acute intracranial abnormality.   Original Report Authenticated By: Charline Bills, M.D.     Assessment: 78 y.o. female with a PMH significant for hypertension, brought to [facility] ED with new acute onset dysarthria, ataxia, vertigo, and decreased hearing. Neuro-exam significant for dysarthria, mild left face weakness, ataxia, and decreased hearing. 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 Stroke service will resume care in the morning and make further recommendations accordingly.   Stroke Risk Factors - age, HTN.  Plan: 1. HgbA1c, fasting lipid panel 2. MRI, MRA  of the brain without contrast 3. PT consult, OT consult, Speech consult 4. Echocardiogram 5. Carotid dopplers 6. Prophylactic therapy-aspirin 81 mg PO daily 7. Risk factor modification 8. Telemetry monitoring 9. Frequent neuro checks   Wyatt Portela, MD [facility] [phone]  06/13/2012, 11:40 AM

## 2012-06-14 DIAGNOSIS — R29818 Other symptoms and signs involving the nervous system: Secondary | ICD-10-CM

## 2012-06-14 DIAGNOSIS — I519 Heart disease, unspecified: Secondary | ICD-10-CM

## 2012-06-14 DIAGNOSIS — I639 Cerebral infarction, unspecified: Secondary | ICD-10-CM | POA: Diagnosis present

## 2012-06-14 DIAGNOSIS — I6340 Cerebral infarction due to embolism of unspecified cerebral artery: Principal | ICD-10-CM

## 2012-06-14 LAB — LIPID PANEL
HDL: 47 mg/dL (ref >39)
LDL Cholesterol: 166 mg/dL — ABNORMAL HIGH (ref 0–99)
Triglycerides: 189 mg/dL — ABNORMAL HIGH (ref <150)

## 2012-06-14 LAB — HEMOGLOBIN A1C
Hgb A1c MFr Bld: 5.5 % (ref <5.7)
Mean Plasma Glucose: 111 mg/dL (ref <117)

## 2012-06-14 MED ORDER — SODIUM CHLORIDE 0.9 % IV SOLN: 250.0000 mL | INTRAVENOUS | Status: DC | PRN

## 2012-06-14 MED ORDER — HYDROCHLOROTHIAZIDE 25 MG PO TABS
25.0000 mg | ORAL_TABLET | Freq: Every day | ORAL | Status: DC
  Administered 2012-06-14: 25 mg via ORAL
  Filled 2012-06-14: qty 1

## 2012-06-14 MED ORDER — CLOPIDOGREL BISULFATE 75 MG PO TABS
75.0000 mg | ORAL_TABLET | Freq: Every day | ORAL | Status: DC
  Administered 2012-06-14 – 2012-06-20 (×7): 75 mg via ORAL
  Filled 2012-06-14 (×9): qty 1

## 2012-06-14 MED ORDER — STROKE: EARLY STAGES OF RECOVERY BOOK
Freq: Once | Status: AC
  Administered 2012-06-14: 11:00:00
  Filled 2012-06-14: qty 1

## 2012-06-14 MED ORDER — SODIUM CHLORIDE 0.9 % IV BOLUS (SEPSIS): 500.0000 mL | Freq: Once | INTRAVENOUS | Status: DC

## 2012-06-14 NOTE — Evaluation (Signed)
Clinical/Bedside Swallow Evaluation Patient Details  Name: DAJA SHUPING MRN: [id number] Date of Birth: 1933/10/27  Today's Date: 06/14/2012 Time: 1310-1400 SLP Time Calculation (min): 50 min  Past Medical History:  Past Medical History  Diagnosis Date  . Hypertension   . Carcinoma of breast, stage 1 2006    s/p Right partial mastectomy   Past Surgical History:  Past Surgical History  Procedure Laterality Date  . Partial mastectomy Right 04/2004    Rose Phi. Young, M.D.   . Breast surgery     HPI:  79 y.o. female with a PMH significant for hypertension, brought to [facility] ED with new acute onset dysarthria, ataxia, vertigo, and decreased hearing. Neuro-exam significant for dysarthria, mild left face weakness, ataxia, and decreased hearing. 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   Assessment / Plan / Recommendation Clinical Impression  Pt demonstrates swallow function WNL. There is no evidence of aspiration. Pt is safe to continue regular diet and thin liquids. No SLP f/u for swallowing needed.     Aspiration Risk  Mild    Diet Recommendation Regular;Thin liquid   Liquid Administration via: Straw Medication Administration: Whole meds with liquid Supervision: Patient able to self feed Postural Changes and/or Swallow Maneuvers: Seated upright 90 degrees    Other  Recommendations Oral Care Recommendations: Patient independent with oral care   Follow Up Recommendations  Outpatient SLP (for dysarthria)    Frequency and Duration        Pertinent Vitals/Pain NA    SLP Swallow Goals     Swallow Study Prior Functional Status       General HPI: 79 y.o. female with a PMH significant for hypertension, brought to [facility] ED with new acute onset dysarthria, ataxia, vertigo, and decreased hearing. Neuro-exam significant for dysarthria, mild left face weakness, ataxia, and decreased hearing. Her  syndrome seems to be consistent with a posterior circulation stroke, with the impaired hearing suggesting involvement of the AICA. Type of Study: Bedside swallow evaluation Diet Prior to this Study: NPO Temperature Spikes Noted: No Respiratory Status: Room air History of Recent Intubation: No Behavior/Cognition: Alert;Cooperative;Pleasant mood Oral Cavity - Dentition: Adequate natural dentition Self-Feeding Abilities: Able to feed self Patient Positioning: Upright in bed Baseline Vocal Quality: Clear Volitional Cough: Strong Volitional Swallow: Able to elicit    Oral/Motor/Sensory Function Overall Oral Motor/Sensory Function: Other (comment) (slow movement/coordination)   Ice Chips     Thin Liquid Thin Liquid: Within functional limits    Nectar Thick     Honey Thick     Puree Puree: Within functional limits   Solid   GO    Solid: Within functional limits      Harlon Ditty, MA CCC-SLP [phone]  Decie Verne, Riley Nearing 06/14/2012,2:19 PM

## 2012-06-14 NOTE — Progress Notes (Signed)
Was called by family. Pt not responding as she was earlier. Pt stated she felt her "head was closing off." Asked pt her name she mumbled her first and last name, she did not follow commands of smiling or lifting eyebrows. She halfway gripped hands. Left side seemed slightly weaker. Neuro check charted in computer. After a few minutes, MD was notified, pts BP 198/73, pt cleared up and started to become more alert and following commands. Will monitor patient.

## 2012-06-14 NOTE — Progress Notes (Signed)
Was called into the room by PT pt was laying in the chair lethargic, left side weak when pt gripped hand. Pt could garble name. Within a few minutes pt came back to baseline. MD called. Will monitor pt.

## 2012-06-14 NOTE — Evaluation (Signed)
Speech Language Pathology Evaluation Patient Details Name: Erin Branch MRN: [id number] DOB: 1933-09-16 Today's Date: 06/14/2012 Time: 1310-1400 SLP Time Calculation (min): 50 min  Problem List:  Patient Active Problem List   Diagnosis Date Noted  . Acute embolic stroke 06/14/2012  . Hypertension 06/13/2012   Past Medical History:  Past Medical History  Diagnosis Date  . Hypertension   . Carcinoma of breast, stage 1 2006    s/p Right partial mastectomy   Past Surgical History:  Past Surgical History  Procedure Laterality Date  . Partial mastectomy Right 04/2004    Rose Phi. Young, M.D.   . Breast surgery     HPI:  78 y.o. female with a PMH significant for hypertension, brought to [facility] ED with new acute onset dysarthria, ataxia, vertigo, and decreased hearing. Neuro-exam significant for dysarthria, mild left face weakness, ataxia, and decreased hearing. 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   Assessment / Plan / Recommendation Clinical Impression  Pt demonstrates mild dysarthria with decreased speed and coordination of articulatory contacts and overall slow rate of speech. Pts cognition and language are WNL. Recommend f/u at outpatient SLP setting unless PT/OT suggest inpatient rehab. No acute f/u needed.     SLP Assessment       Follow Up Recommendations  Outpatient SLP (for dysarthria)    Frequency and Duration        Pertinent Vitals/Pain NA   SLP Goals     SLP Evaluation Prior Functioning  Cognitive/Linguistic Baseline: Within functional limits   Cognition  Overall Cognitive Status: Within Functional Limits for tasks assessed Arousal/Alertness: Awake/alert Orientation Level: Oriented X4 Attention: Focused;Sustained;Selective;Alternating Focused Attention: Appears intact Sustained Attention: Appears intact Selective Attention: Appears intact Alternating Attention:  Appears intact Memory: Appears intact Awareness: Appears intact Problem Solving: Appears intact Safety/Judgment: Appears intact    Comprehension  Auditory Comprehension Overall Auditory Comprehension: Appears within functional limits for tasks assessed    Expression Verbal Expression Overall Verbal Expression: Appears within functional limits for tasks assessed   Oral / Motor Oral Motor/Sensory Function Overall Oral Motor/Sensory Function: Other (comment) (slow movement/coordination) Motor Speech Overall Motor Speech: Impaired Respiration: Within functional limits Phonation: Normal Resonance:  (abnormal resonance, reduced coordination of velum) Articulation: Impaired Level of Impairment: Word Intelligibility: Intelligible Motor Speech Errors: Aware   GO    Harlon Ditty, MA CCC-SLP [phone]  Claudine Mouton 06/14/2012, 2:25 PM

## 2012-06-14 NOTE — Progress Notes (Signed)
Medical Student Daily Progress Note  Subjective: Pt had MRI/MRA for which she was given 2 mg of ativan.  She remained somnolent after the study.  Status was discussed with neurology who agreed somnolence was related to ativan and thus no repeat head CT was ordered.  Otherwise NAE. Reports feeling better overall this morning.  Eager to eat and walk around.  C/o mild dizziness. Otherwise no complaints   Objective: Vital signs in last 24 hours: Filed Vitals:   06/13/12 2200 06/14/12 0000 06/14/12 0200 06/14/12 0400  BP: 190/81 176/78 185/75 187/80  Pulse: 107 104 102 107  Temp:    97.7 F (36.5 C)  TempSrc:      Resp:    16  SpO2:    100%   Weight change:   Intake/Output Summary (Last 24 hours) at 06/14/12 0919 Last data filed at 06/13/12 1900  Gross per 24 hour  Intake      0 ml  Output    300 ml  Net   -300 ml    Physical Exam: BP 187/80  Pulse 107  Temp(Src) 97.7 F (36.5 C) (Oral)  Resp 16  SpO2 100% General appearance: alert, cooperative and appears older than stated age.  Sitting on edge of bed eating breakfast.   Head: Normocephalic, without obvious abnormality, atraumatic Eyes: PERRL. EOMI with horizontal nystagmus when looking right, decreased from yesterday.  Throat: MMM, food particles throughout Lungs: clear to auscultation bilaterally though exam limited by pt coughing. [place] in place. Heart: regular rate and rhythm, S1, S2 normal, no murmur, click, rub or gallop  Abdomen: soft, non-tender; bowel sounds normal; no masses, no organomegaly  Extremities: trace BLE edema. Otherwise extremities normal, atraumatic, no cyanosis Pulses: 2+ and symmetric  Neurologic: Slurred speech, but easier to understand than yesterday. Requires raised voice to hear questions.  Unable to visualize uvula. Otherwise CN II-XII intact. Continues to have occasional coughing spells during exam.  Slower R rapid-hand-movement on R versus L, improved from yesterday. Normal finger-to-nose. Gait was  not examined.   Lab Results: Basic Metabolic Panel:  Recent Labs  16/10/96 0940 06/13/12 0951  NA 139 141  K 4.0 4.0  CL 103 107  CO2 24  --   GLUCOSE 119* 119*  BUN 13 13  CREATININE 0.80 0.90  CALCIUM 10.1  --    Liver Function Tests:  Recent Labs  06/13/12 0940  AST 16  ALT 11  ALKPHOS 137*  BILITOT 0.2*  PROT 6.9  ALBUMIN 3.2*   No results found for this basename: LIPASE, AMYLASE,  in the last 72 hours  Recent Labs  06/13/12 1045  AMMONIA 20   CBC:  Recent Labs  06/13/12 0940 06/13/12 0951  WBC 6.4  --   NEUTROABS 5.1  --   HGB 11.7* 11.6*  HCT 34.5* 34.0*  MCV 82.3  --   PLT 270  --    Cardiac Enzymes:  Recent Labs  06/13/12 0940  TROPONINI <0.30   BNP: No results found for this basename: PROBNP,  in the last 72 hours D-Dimer: No results found for this basename: DDIMER,  in the last 72 hours CBG:  Recent Labs  06/13/12 0853  GLUCAP 123*   Hemoglobin A1C: No results found for this basename: HGBA1C,  in the last 72 hours Fasting Lipid Panel:  Recent Labs  06/14/12 0442  CHOL 251*  HDL 47  LDLCALC 166*  TRIG 189*  CHOLHDL 5.3   Thyroid Function Tests: No results found for  this basename: TSH, T4TOTAL, FREET4, T3FREE, THYROIDAB,  in the last 72 hours Anemia Panel: No results found for this basename: VITAMINB12, FOLATE, FERRITIN, TIBC, IRON, RETICCTPCT,  in the last 72 hours Coagulation:  Recent Labs  06/13/12 0940  LABPROT 13.4  INR 1.03   Urine Drug Screen: Drugs of Abuse  No results found for this basename: labopia, cocainscrnur, labbenz, amphetmu, thcu, labbarb    Alcohol Level: No results found for this basename: ETH,  in the last 72 hours Urinalysis:  Recent Labs  06/13/12 0925  COLORURINE YELLOW  LABSPEC 1.009  PHURINE 7.5  GLUCOSEU NEGATIVE  HGBUR NEGATIVE  BILIRUBINUR NEGATIVE  KETONESUR NEGATIVE  PROTEINUR NEGATIVE  UROBILINOGEN 0.2  NITRITE NEGATIVE  LEUKOCYTESUR NEGATIVE    Micro  Results: No results found for this or any previous visit (from the past 240 hour(s)).  Studies/Results: Dg Chest 2 View  06/13/2012   *RADIOLOGY REPORT*  Clinical Data: Dizziness.  CHEST - 2 VIEW  Comparison: 05/05/2004  Findings: Mild hyperinflation of the lungs.  Linear scarring or atelectasis at the left base.  Right lung is clear.  No effusions or acute bony abnormality.  IMPRESSION: Left basilar scarring or atelectasis.  Mild hyperinflation.   Original Report Authenticated By: Charlett Nose, M.D.   Ct Head Wo Contrast  06/13/2012   *RADIOLOGY REPORT*  Clinical Data: Acute left pontine infarct.  Persistent somnolence. Assess for progression of infarct.  CT HEAD WITHOUT CONTRAST  Technique:  Contiguous axial images were obtained from the base of the skull through the vertex without contrast.  Comparison: CT of the head and MRI of the brain performed earlier today at 10:23 a.m. and 03:55 p.m.  Findings:   Scattered small acute infarcts were noted on MRI within both cerebellar hemispheres and the left pons; the appearance is most compatible with an embolic event.  The cerebellar infarcts are slightly better characterized on the current study.  The left pontine infarct is only minimally visualized.  There is no evidence of hemorrhagic transformation.  No mass effect is seen.  Minimal periventricular white matter change likely reflects small vessel ischemic microangiopathy.  A prominent focus of calcification is noted at the left globus pallidus.  The third and lateral ventricles, and basal ganglia are unremarkable in appearance.  The cerebral hemispheres are symmetric in appearance, with normal gray-white differentiation.  No mass effect or midline shift is seen.  There is no evidence of fracture; visualized osseous structures are unremarkable in appearance.  The visualized portions of the orbits are within normal limits.  The paranasal sinuses and mastoid air cells are well-aerated.  No significant soft  tissue abnormalities are seen.  IMPRESSION: Scattered small acute infarcts noted within both cerebellar hemispheres and the left pons on recent MRI.  The appearance is most compatible with an embolic event.  The cerebellar infarcts are slightly better characterized on the current study; the left pontine infarct is only minimally visualized on CT.  No evidence of hemorrhagic transformation.   Original Report Authenticated By: Tonia Ghent, M.D.   Ct Head Wo Contrast  06/13/2012   *RADIOLOGY REPORT*  Clinical Data: Dizziness, slurred speech, left facial droop  CT HEAD WITHOUT CONTRAST  Technique:  Contiguous axial images were obtained from the base of the skull through the vertex without contrast.  Comparison: None.  Findings: No evidence of parenchymal hemorrhage or extra-axial fluid collection. No mass lesion, mass effect, or midline shift.  No CT evidence of acute infarction.  Mild subcortical white matter and periventricular  small vessel ischemic changes.  Intracranial atherosclerosis.  Cerebral volume is age appropriate.  No ventriculomegaly.  The visualized paranasal sinuses are essentially clear. The mastoid air cells are unopacified.  No evidence of calvarial fracture.  IMPRESSION: No evidence of acute intracranial abnormality.   Original Report Authenticated By: Charline Bills, M.D.   Mr [facility] Wo Contrast  06/13/2012   **ADDENDUM** CREATED: 06/13/2012 22:48:43  Note that the impression for the MRI of the brain should read: Small acute infarcts in the cerebellum bilaterally; small acute infarct in the left pons.  **END ADDENDUM** SIGNED BY: Tonia Ghent, M.D.  06/13/2012   *RADIOLOGY REPORT*  Clinical Data:  Dizziness.  Slurred speech.  MRI HEAD WITHOUT CONTRAST MRA HEAD WITHOUT CONTRAST  Technique:  Multiplanar, multiecho pulse sequences of the brain and surrounding structures were obtained without intravenous contrast. Angiographic images of the head were obtained using MRA technique without  contrast.  Comparison:  CT head 06/13/2012  MRI HEAD  Findings:  Multiple small areas of acute infarct in the cerebellum bilaterally.  Small acute infarct in the left pons.  No acute infarct above the tentorium.  Ventricle size is normal.  Minimal chronic microvascular ischemic change in the cerebral white matter.  Negative for hemorrhage or mass lesion.  Increased CSF over the convexity felt to be due to parietal atrophy. Asymmetric calcification left basal ganglia.  IMPRESSION: Chronic infarcts in the cerebellum bilaterally.  Acute infarct left pons.  MRA HEAD  Findings: Distal vertebral artery is diffusely diseased and ends in pica bilaterally.  Vertebral arteries do not appear to contribute to the basilar.  There is minimal flow in the mid basilar which appears diffusely diseased.  This is most likely fed by collateral circulation.  There is fetal origin of the posterior cerebral artery bilaterally which are widely patent.  Cerebellar arteries are not well seen likely due to slow flow and/or occlusion.  Internal carotid artery is patent bilaterally without significant stenosis.  Anterior  and middle cerebral arteries are patent bilaterally.  There is mild atherosclerotic disease involving middle cerebral artery branches bilaterally.  No aneurysm is identified.  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  There is very little flow in the basilar which is diffusely diseased, accounting for acute bilateral cerebellar and left pontine infarcts.  Original Report Authenticated By: Janeece Riggers, M.D.   Mr Brain Wo Contrast  06/13/2012   **ADDENDUM** CREATED: 06/13/2012 22:48:43  Note that the impression for the MRI of the brain should read: Small acute infarcts in the cerebellum bilaterally; small acute infarct in the left pons.  **END ADDENDUM** SIGNED BY: Tonia Ghent, M.D.  06/13/2012   *RADIOLOGY REPORT*  Clinical Data:  Dizziness.  Slurred speech.  MRI HEAD WITHOUT CONTRAST MRA HEAD  WITHOUT CONTRAST  Technique:  Multiplanar, multiecho pulse sequences of the brain and surrounding structures were obtained without intravenous contrast. Angiographic images of the head were obtained using MRA technique without contrast.  Comparison:  CT head 06/13/2012  MRI HEAD  Findings:  Multiple small areas of acute infarct in the cerebellum bilaterally.  Small acute infarct in the left pons.  No acute infarct above the tentorium.  Ventricle size is normal.  Minimal chronic microvascular ischemic change in the cerebral white matter.  Negative for hemorrhage or mass lesion.  Increased CSF over the convexity felt to be due to parietal atrophy. Asymmetric calcification left basal ganglia.  IMPRESSION: Chronic infarcts in the cerebellum bilaterally.  Acute infarct left pons.  MRA  HEAD  Findings: Distal vertebral artery is diffusely diseased and ends in pica bilaterally.  Vertebral arteries do not appear to contribute to the basilar.  There is minimal flow in the mid basilar which appears diffusely diseased.  This is most likely fed by collateral circulation.  There is fetal origin of the posterior cerebral artery bilaterally which are widely patent.  Cerebellar arteries are not well seen likely due to slow flow and/or occlusion.  Internal carotid artery is patent bilaterally without significant stenosis.  Anterior  and middle cerebral arteries are patent bilaterally.  There is mild atherosclerotic disease involving middle cerebral artery branches bilaterally.  No aneurysm is identified.  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  There is very little flow in the basilar which is diffusely diseased, accounting for acute bilateral cerebellar and left pontine infarcts.  Original Report Authenticated By: Janeece Riggers, M.D.    Medications: I have reviewed the patient's current medications. Scheduled Meds: . aspirin EC  81 mg Oral Daily  . atorvastatin  80 mg Oral q1800  . enoxaparin (LOVENOX)  injection  40 mg Subcutaneous Q24H  . sodium chloride  3 mL Intravenous Q12H   Continuous Infusions:  PRN Meds:.sodium chloride, sodium chloride   Assessment/Plan: 79 yo F with a h/o HTN and CHF with CVA  involving cerebellum bilaterally and left pons.  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 -Neuro following. Appreciate recs. Plavix and ASA 81 mg daily x 3 months.  Single therapy thereafter with plavix.  -Continue frequent neuro checks.  -Continue stroke work-up: HgA1C, echo, carotid dopplers pending.  -On 2 L [place]. Wean as tolerated with goal SpO2 >92%  -Speech/OT/PT eval today. Repeat swallow eval given coughing spells. -Cardiac monitoring.   HTN  -HDS.  -Permissive HTN with goal systolic <220.  -Restart valsartan today.  HLD -Fasting lipid panel indicates HDL.   -Start atorvastatin 80 mg today.  CHF  -Stable, no evidence of fluid overload.  -Echo today.   Prophylaxis: lovenox 40 mg q24h   Access: L forearm PIV (NS 5/27)   Code: DNR   Dispo: floor status  -Anticipated date of discharge: 1-3 days  This is a Psychologist, occupational Note.  The care of the patient was discussed with Dr. Dorma Russell and the assessment and plan formulated with their assistance.  Please see their attached note for official documentation of the daily encounter.  Harland Dingwall [facility] 06/14/2012, 9:19 AM

## 2012-06-14 NOTE — Progress Notes (Signed)
Echocardiogram 2D Echocardiogram has been performed.  Erin Branch 06/14/2012, 10:06 AM

## 2012-06-14 NOTE — Progress Notes (Signed)
MD ordered Foley catheter. Pt refuses to be cathed she wants to pick her hips up and keep her head flat.

## 2012-06-14 NOTE — Evaluation (Signed)
Occupational Therapy Evaluation Patient Details Name: Erin Branch MRN: [id number] DOB: [birth year] Today's Date: 06/14/2012 Time: 4098-1191 OT Time Calculation (min): 62 min  OT Assessment / Plan / Recommendation Clinical Impression  Pt admitted with multiple cerebellar and posterior infarcts. Patient tolerated session well with minimal assist required however when providing patient education at the conclusion of session patient became confused, glazed over, then unresponsive. Nsg and subsequently MD called to the bedside due to this event with sudden onset left sided weakness and confusion with worsening slurred speech. Patient placed in reclined position and verbal arousal techniques employed to maintain responsiveness. Symptoms lasted approximately 5 minutes but resolved prior to MD entering the room. Instructed by MD to assist patient back to bed supine in flatten position. Pt tolerated stand pivot to bed well but when sitting on EOB began to once again demonstrate symptoms. MD continued to assess.     OT Assessment  Patient needs continued OT Services    Follow Up Recommendations  Home health OT;Supervision/Assistance - 24 hour    Barriers to Discharge      Equipment Recommendations  None recommended by OT    Recommendations for Other Services    Frequency  Min 3X/week    Precautions / Restrictions     Pertinent Vitals/Pain 195/68 EOB; 207/93 Standing; 171/62 post activity during TIA-like  episode     ADL  Grooming: Performed;Wash/dry face;Set up Where Assessed - Grooming: Supine, head of bed up Upper Body Bathing: Simulated;Supervision/safety Where Assessed - Upper Body Bathing: Unsupported sitting Lower Body Bathing: Simulated;Minimal assistance Where Assessed - Lower Body Bathing: Supported sit to stand Upper Body Dressing: Simulated;Set up Where Assessed - Upper Body Dressing: Unsupported sitting Lower Body Dressing: Simulated;Minimal assistance Where Assessed - Lower  Body Dressing: Supported sit to stand Toilet Transfer: Performed;Minimal assistance Toilet Transfer Method:  (ambulating) Acupuncturist: Materials engineer and Hygiene: Performed;Min guard Where Assessed - Engineer, mining and Hygiene: Sit to stand from 3-in-1 or toilet Equipment Used: Gait belt;Rolling walker Transfers/Ambulation Related to ADLs: min assist with RW. ADL Comments: Pt tolerated session well but at end of session experienced 2 separate episodes of left side weakness/slurred speech/confusion (see impression statement).     OT Diagnosis: Generalized weakness;Cognitive deficits  OT Problem List: Decreased strength;Decreased activity tolerance;Impaired balance (sitting and/or standing);Decreased cognition;Decreased knowledge of use of DME or AE OT Treatment Interventions: Self-care/ADL training;DME and/or AE instruction;Therapeutic activities;Cognitive remediation/compensation;Patient/family education;Balance training   OT Goals Acute Rehab OT Goals OT Goal Formulation: With patient Time For Goal Achievement: 06/28/12 Potential to Achieve Goals: Good ADL Goals Pt Will Perform Grooming: with supervision;Unsupported;Standing at sink ADL Goal: Grooming - Progress: Goal set today Pt Will Perform Upper Body Dressing: with set-up;Sitting, chair;Sitting, bed;Unsupported ADL Goal: Upper Body Dressing - Progress: Goal set today Pt Will Perform Lower Body Dressing: with supervision;Sit to stand from chair;Sit to stand from bed ADL Goal: Lower Body Dressing - Progress: Goal set today Pt Will Transfer to Toilet: with supervision;with DME;Ambulation;Comfort height toilet ADL Goal: Toilet Transfer - Progress: Goal set today Pt Will Perform Toileting - Clothing Manipulation: with supervision;Standing ADL Goal: Toileting - Clothing Manipulation - Progress: Goal set today Pt Will Perform Toileting - Hygiene: with supervision;Sit to  stand from 3-in-1/toilet ADL Goal: Toileting - Hygiene - Progress: Goal set today  Visit Information  Last OT Received On: 06/14/12 Assistance Needed: +2 (due to medical events during session) PT/OT Co-Evaluation/Treatment: Yes    Subjective Data  Prior Functioning     Home Living Lives With: Spouse Available Help at Discharge: Family;Available 24 hours/day Type of Home: House Home Access: Stairs to enter [employer] of Steps: 2 Home Layout: Two level;Full bath on main level Bathroom Shower/Tub: Tub/shower unit;Walk-in shower Bathroom Toilet: Standard Home Adaptive Equipment: Tub transfer bench;Bedside commode/3-in-1 Prior Function Level of Independence: Independent Driving: Yes Vocation: Retired Musician: Expressive difficulties Dominant Hand: Right         Vision/Perception Vision - Assessment Additional Comments: WFL during tasks assessed. Reports she feels like the room is spinning when she feels dizzy. Will continue to assess.   Cognition  Cognition Arousal/Alertness: Awake/alert Behavior During Therapy: WFL for tasks assessed/performed Overall Cognitive Status: Within Functional Limits for tasks assessed    Extremity/Trunk Assessment Right Upper Extremity Assessment RUE ROM/Strength/Tone: WFL for tasks assessed RUE Sensation: WFL - Light Touch;WFL - Proprioception RUE Coordination: WFL - gross/fine motor Left Upper Extremity Assessment LUE ROM/Strength/Tone: WFL for tasks assessed LUE Sensation: WFL - Light Touch;WFL - Proprioception LUE Coordination: WFL - gross/fine motor Right Lower Extremity Assessment RLE ROM/Strength/Tone: WFL for tasks assessed RLE Sensation: WFL - Light Touch;WFL - Proprioception Left Lower Extremity Assessment LLE ROM/Strength/Tone: WFL for tasks assessed (transient weakness during episode of stroke like events) LLE Sensation: WFL - Light Touch;WFL - Proprioception Trunk Assessment Trunk  Assessment: Normal     Mobility Bed Mobility Bed Mobility: Supine to Sit;Sitting - Scoot to [place] of Bed;Sit to Supine Supine to Sit: 4: Min guard Sitting - Scoot to [place] of Bed: 4: Min guard Sit to Supine: 3: Mod assist Details for Bed Mobility Assistance: As patient returning back to ped at MD request, patient began to again experience transient slurred speech, glazed over eyes, left extremity weakness and difficulty moving requiring assist to supine. Transfers Transfers: Sit to Stand;Stand to Sit Sit to Stand: 4: Min assist;From bed;From chair/3-in-1 Stand to Sit: 4: Min assist;To bed;To chair/3-in-1 Details for Transfer Assistance: VCs for safe hand placement.Assist for steadying due to initial posterior lean and to control descent.      Exercise     Balance     End of Session OT - End of Session Equipment Utilized During Treatment: Gait belt Activity Tolerance: Treatment limited secondary to medical complications (Comment) Patient left: in bed;with call bell/phone within reach;with family/visitor present;with nursing in room Nurse Communication: Mobility status;Other (comment) (stroke like symptoms occurring)  GO    06/14/2012 Cipriano Mile OTR/L Pager [phone] Office [phone]  Cipriano Mile 06/14/2012, 5:08 PM

## 2012-06-14 NOTE — Progress Notes (Addendum)
Subjective: MRI addendum shows small acute infarcts in cerebellum bilaterally; small acute infarct in the left pons. Difficulty arousing after giving 2mg  po Ativan prior to MRI. CT head obtained showed no hemorrhagic event. More alert this morning and is requesting to eat. Still no swallow study with speech therapy. Denies any SOB, chest pain, numbness or tingling of her extremities.  Objective: Vital signs in last 24 hours: Filed Vitals:   06/14/12 0000 06/14/12 0200 06/14/12 0400 06/14/12 0800  BP: 176/78 185/75 187/80 190/71  Pulse: 104 102 107 97  Temp:   97.7 F (36.5 C) 97.7 F (36.5 C)  TempSrc:    Oral  Resp:   16 18  SpO2:   100% 100%   Weight change:   Intake/Output Summary (Last 24 hours) at 06/14/12 1052 Last data filed at 06/13/12 1900  Gross per 24 hour  Intake      0 ml  Output    300 ml  Net   -300 ml  Vitals reviewed. General: resting in bed, NAD HEENT: PERRL, EOMI, no scleral icterus Cardiac: RRR, no rubs, murmurs or gallops Pulm: clear to auscultation bilaterally, no wheezes, rales, or rhonchi Abd: soft, nontender, nondistended, BS present Ext: warm and well perfused, no pedal edema Neuro: alert and oriented X3, improved rapid hand movements but still slower and with overall difficulty in bilateral hands. Mild right eyelid droop and mild right lateral lip lag.   Lab Results: Basic Metabolic Panel:  Recent Labs Lab 06/13/12 0940 06/13/12 0951  NA 139 141  K 4.0 4.0  CL 103 107  CO2 24  --   GLUCOSE 119* 119*  BUN 13 13  CREATININE 0.80 0.90  CALCIUM 10.1  --    Liver Function Tests:  Recent Labs Lab 06/13/12 0940  AST 16  ALT 11  ALKPHOS 137*  BILITOT 0.2*  PROT 6.9  ALBUMIN 3.2*    Recent Labs Lab 06/13/12 1045  AMMONIA 20   CBC:  Recent Labs Lab 06/13/12 0940 06/13/12 0951  WBC 6.4  --   NEUTROABS 5.1  --   HGB 11.7* 11.6*  HCT 34.5* 34.0*  MCV 82.3  --   PLT 270  --    Cardiac Enzymes:  Recent Labs Lab  06/13/12 0940  TROPONINI <0.30   CBG:  Recent Labs Lab 06/13/12 0853  GLUCAP 123*   Hemoglobin A1C: No results found for this basename: HGBA1C,  in the last 168 hours  Fasting Lipid Panel:  Recent Labs Lab 06/14/12 0442  CHOL 251*  HDL 47  LDLCALC 166*  TRIG 189*  CHOLHDL 5.3   Thyroid Function Tests: No results found for this basename: TSH, T4TOTAL, FREET4, T3FREE, THYROIDAB,  in the last 168 hours  Coagulation:  Recent Labs Lab 06/13/12 0940  LABPROT 13.4  INR 1.03   Urinalysis:  Recent Labs Lab 06/13/12 0925  COLORURINE YELLOW  LABSPEC 1.009  PHURINE 7.5  GLUCOSEU NEGATIVE  HGBUR NEGATIVE  BILIRUBINUR NEGATIVE  KETONESUR NEGATIVE  PROTEINUR NEGATIVE  UROBILINOGEN 0.2  NITRITE NEGATIVE  LEUKOCYTESUR NEGATIVE   Studies/Results: Dg Chest 2 View  06/13/2012   *RADIOLOGY REPORT*  Clinical Data: Dizziness.  CHEST - 2 VIEW  Comparison: 05/05/2004  Findings: Mild hyperinflation of the lungs.  Linear scarring or atelectasis at the left base.  Right lung is clear.  No effusions or acute bony abnormality.  IMPRESSION: Left basilar scarring or atelectasis.  Mild hyperinflation.   Original Report Authenticated By: Charlett Nose, M.D.   Ct Head  Wo Contrast  06/13/2012   *RADIOLOGY REPORT*  Clinical Data: Acute left pontine infarct.  Persistent somnolence. Assess for progression of infarct.  CT HEAD WITHOUT CONTRAST  Technique:  Contiguous axial images were obtained from the base of the skull through the vertex without contrast.  Comparison: CT of the head and MRI of the brain performed earlier today at 10:23 a.m. and 03:55 p.m.  Findings:   Scattered small acute infarcts were noted on MRI within both cerebellar hemispheres and the left pons; the appearance is most compatible with an embolic event.  The cerebellar infarcts are slightly better characterized on the current study.  The left pontine infarct is only minimally visualized.  There is no evidence of hemorrhagic  transformation.  No mass effect is seen.  Minimal periventricular white matter change likely reflects small vessel ischemic microangiopathy.  A prominent focus of calcification is noted at the left globus pallidus.  The third and lateral ventricles, and basal ganglia are unremarkable in appearance.  The cerebral hemispheres are symmetric in appearance, with normal gray-white differentiation.  No mass effect or midline shift is seen.  There is no evidence of fracture; visualized osseous structures are unremarkable in appearance.  The visualized portions of the orbits are within normal limits.  The paranasal sinuses and mastoid air cells are well-aerated.  No significant soft tissue abnormalities are seen.  IMPRESSION: Scattered small acute infarcts noted within both cerebellar hemispheres and the left pons on recent MRI.  The appearance is most compatible with an embolic event.  The cerebellar infarcts are slightly better characterized on the current study; the left pontine infarct is only minimally visualized on CT.  No evidence of hemorrhagic transformation.   Original Report Authenticated By: Tonia Ghent, M.D.   Ct Head Wo Contrast  06/13/2012   *RADIOLOGY REPORT*  Clinical Data: Dizziness, slurred speech, left facial droop  CT HEAD WITHOUT CONTRAST  Technique:  Contiguous axial images were obtained from the base of the skull through the vertex without contrast.  Comparison: None.  Findings: No evidence of parenchymal hemorrhage or extra-axial fluid collection. No mass lesion, mass effect, or midline shift.  No CT evidence of acute infarction.  Mild subcortical white matter and periventricular small vessel ischemic changes.  Intracranial atherosclerosis.  Cerebral volume is age appropriate.  No ventriculomegaly.  The visualized paranasal sinuses are essentially clear. The mastoid air cells are unopacified.  No evidence of calvarial fracture.  IMPRESSION: No evidence of acute intracranial abnormality.    Original Report Authenticated By: Charline Bills, M.D.   Mr [facility] Wo Contrast  06/13/2012   **ADDENDUM** CREATED: 06/13/2012 22:48:43  Note that the impression for the MRI of the brain should read: Small acute infarcts in the cerebellum bilaterally; small acute infarct in the left pons.  **END ADDENDUM** SIGNED BY: Tonia Ghent, M.D.  06/13/2012   *RADIOLOGY REPORT*  Clinical Data:  Dizziness.  Slurred speech.  MRI HEAD WITHOUT CONTRAST MRA HEAD WITHOUT CONTRAST  Technique:  Multiplanar, multiecho pulse sequences of the brain and surrounding structures were obtained without intravenous contrast. Angiographic images of the head were obtained using MRA technique without contrast.  Comparison:  CT head 06/13/2012  MRI HEAD  Findings:  Multiple small areas of acute infarct in the cerebellum bilaterally.  Small acute infarct in the left pons.  No acute infarct above the tentorium.  Ventricle size is normal.  Minimal chronic microvascular ischemic change in the cerebral white matter.  Negative for hemorrhage or mass lesion.  Increased  CSF over the convexity felt to be due to parietal atrophy. Asymmetric calcification left basal ganglia.  IMPRESSION: Chronic infarcts in the cerebellum bilaterally.  Acute infarct left pons.  MRA HEAD  Findings: Distal vertebral artery is diffusely diseased and ends in pica bilaterally.  Vertebral arteries do not appear to contribute to the basilar.  There is minimal flow in the mid basilar which appears diffusely diseased.  This is most likely fed by collateral circulation.  There is fetal origin of the posterior cerebral artery bilaterally which are widely patent.  Cerebellar arteries are not well seen likely due to slow flow and/or occlusion.  Internal carotid artery is patent bilaterally without significant stenosis.  Anterior  and middle cerebral arteries are patent bilaterally.  There is mild atherosclerotic disease involving middle cerebral artery branches bilaterally.  No  aneurysm is identified.  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  There is very little flow in the basilar which is diffusely diseased, accounting for acute bilateral cerebellar and left pontine infarcts.  Original Report Authenticated By: Janeece Riggers, M.D.   Mr Brain Wo Contrast  06/13/2012   **ADDENDUM** CREATED: 06/13/2012 22:48:43  Note that the impression for the MRI of the brain should read: Small acute infarcts in the cerebellum bilaterally; small acute infarct in the left pons.  **END ADDENDUM** SIGNED BY: Tonia Ghent, M.D.  06/13/2012   *RADIOLOGY REPORT*  Clinical Data:  Dizziness.  Slurred speech.  MRI HEAD WITHOUT CONTRAST MRA HEAD WITHOUT CONTRAST  Technique:  Multiplanar, multiecho pulse sequences of the brain and surrounding structures were obtained without intravenous contrast. Angiographic images of the head were obtained using MRA technique without contrast.  Comparison:  CT head 06/13/2012  MRI HEAD  Findings:  Multiple small areas of acute infarct in the cerebellum bilaterally.  Small acute infarct in the left pons.  No acute infarct above the tentorium.  Ventricle size is normal.  Minimal chronic microvascular ischemic change in the cerebral white matter.  Negative for hemorrhage or mass lesion.  Increased CSF over the convexity felt to be due to parietal atrophy. Asymmetric calcification left basal ganglia.  IMPRESSION: Chronic infarcts in the cerebellum bilaterally.  Acute infarct left pons.  MRA HEAD  Findings: Distal vertebral artery is diffusely diseased and ends in pica bilaterally.  Vertebral arteries do not appear to contribute to the basilar.  There is minimal flow in the mid basilar which appears diffusely diseased.  This is most likely fed by collateral circulation.  There is fetal origin of the posterior cerebral artery bilaterally which are widely patent.  Cerebellar arteries are not well seen likely due to slow flow and/or occlusion.  Internal carotid  artery is patent bilaterally without significant stenosis.  Anterior  and middle cerebral arteries are patent bilaterally.  There is mild atherosclerotic disease involving middle cerebral artery branches bilaterally.  No aneurysm is identified.  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  There is very little flow in the basilar which is diffusely diseased, accounting for acute bilateral cerebellar and left pontine infarcts.  Original Report Authenticated By: Janeece Riggers, M.D.   Medications: I have reviewed the patient's current medications. Scheduled Meds: . aspirin EC  81 mg Oral Daily  . atorvastatin  80 mg Oral q1800  . enoxaparin (LOVENOX) injection  40 mg Subcutaneous Q24H  . sodium chloride  3 mL Intravenous Q12H   Continuous Infusions:  PRN Meds:.sodium chloride, sodium chloride  Assessment/Plan: 79yo F with PMH HTN, GERD, breast cancer  s/p lumpectomy, and possible CHF present to the ED with dizziness, dysarthria, and difficulty ambulating.   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 Her symptoms seem more consistent with a posterior circulation stroke, possibly involving the AICA. Neurology was consulted and recommended an MRI. Initial thoughts pointed toward CVA 2/2 HTN, given her history of uncontrolled HTN, with BP recorded as high as 199/66 on admission. However, MRI showed small acute infarcts in cerebellum bilaterally; small acute infarct in the left pons. Likely from carotids vs cardiac source. Lipid panel with elevated total cholesterol and LDL, Atorvastatin started this admission. - Neuro checks q4h  - Vital signs q4h  - Lipid panel  - ASA 81mg  po daily and Plavix 75mg  po daily for 52mo, then one therapy alone. - PT/OT eval  - SLP  swallow  - 2D ECHO  - Carotid dopplers  - F/u with Neurology, appreciate recommendations   HTN:  Chronic HTN, not taking medication but supposed to be on Diovan. Alllowed for permissive HTN overnight in the setting of her CVA. BP still elevated this morning. Starting HCTZ once passes swallow study with speech.  - Start HCTZ 25mg  po daily after SLp swallow study  DVT PPx:  Lovenox, SCDs   Dispo: Disposition is deferred at this time, awaiting improvement of current medical problems.  Anticipated discharge in approximately 1-2 day(s).   The patient does have a current PCP (SCHULTZ,DOUGLAS E, MD), therefore will be requiring OPC follow-up after discharge.   The patient does not have transportation limitations that hinder transportation to clinic appointments.  .Services Needed at time of discharge: Y = Yes, Blank = No PT:   OT:   RN:   Equipment:   Other:     LOS: 1 day   Genelle Gather 06/14/2012, 10:52 AM

## 2012-06-14 NOTE — Progress Notes (Signed)
*  PRELIMINARY RESULTS* Vascular Ultrasound Carotid Duplex (Doppler) has been completed.  Preliminary findings: Bilateral:  No evidence of hemodynamically significant internal carotid artery stenosis.   Vertebral artery flow is antegrade.      Farrel Demark, RDMS, RVT  06/14/2012, 11:03 AM

## 2012-06-14 NOTE — Evaluation (Signed)
Physical Therapy Evaluation Patient Details Name: Erin Branch MRN: [id number] DOB: March 21, 1933 Today's Date: 06/14/2012 Time: 4098-1191 PT Time Calculation (min): 61 min  PT Assessment / Plan / Recommendation Clinical Impression  Pt is a pleasant 79 y.o. female who presents s/p multiple cerebellar and posterior infarcts Patient tolerated session well with minimal assist required however when providing patient education at the conclusion of session patient became confused, glazed over, then unresponsive. Nsg and subsequently MD called to the bedside due to this event with sudden onset left sided weakness and confusion with worsening slurred speech. Patient placed in reclined position and verbal arousal techniques employed to maintain responsiveness. Symptoms lasted approximately 5 minutes but resolved prior to MD entering the room.  Instructed by MD to assist patient back to bed supine in flatten position.  Pt tolerated stand pivot to bed well but when sitting on EOB began to once again demonstrate symptoms. MD continued to assess.  Indicated that we would continue to follow patient and monitor medical status. Feel patient will benefit from skilled PT to address deficits as indicated. Will see acutely and recommend dc home with HHPT to follow is medical events clear.    PT Assessment  Patient needs continued PT services    Follow Up Recommendations  Home health PT;Supervision/Assistance - 24 hour          Equipment Recommendations  Rolling walker with 5" wheels       Frequency Min 3X/week    Precautions / Restrictions     Pertinent Vitals/Pain 195/68 EOB; 207/93 Standing; 171/62 post activity during TIA episode      Mobility  Bed Mobility Bed Mobility: Supine to Sit;Sitting - Scoot to [employer] of Bed;Sit to Supine Supine to Sit: 4: Min guard Sitting - Scoot to [employer] of Bed: 4: Min guard Sit to Supine: 3: Mod assist (Transient event occuring during transfer) Details for Bed Mobility  Assistance: As patient returning back to ped at MD request, patient began to again experience transient slurred speech, glazed over eyes, left extremity weakness and difficulty moving requiring assist to supine. Transfers Transfers: Sit to Stand;Stand to [employer] Transfers Sit to Stand: 4: Min guard;4: Min assist;From bed;From chair/3-in-1 Stand to Sit: 4: Min guard;4: Min assist;To bed;To chair/3-in-1 Stand Pivot Transfers: 4: Min guard Details for Transfer Assistance: VCs for hand placement. Noted instability posteriorly requiring minimal assist to correct on one occassion. Assist required to control descent into chair. Ambulation/Gait Ambulation/Gait Assistance: 4: Min assist Ambulation Distance (Feet): 160 Feet Assistive device: Rolling walker Ambulation/Gait Assistance Details: Assist for pacing and posterior support as patient has multiple occurances of posterior retropulsion intermittantly.  Gait Pattern: Step-through pattern;Decreased stride length;Narrow base of support Gait velocity: decreased requires pacing General Gait Details: patient with some noted instability requiring itermittant assist and pacing. Stairs: No Modified Rankin (Stroke Patients Only) Pre-Morbid Rankin Score: No symptoms Modified Rankin: Moderate disability        PT Diagnosis: Difficulty walking;Generalized weakness  PT Problem List: Decreased activity tolerance;Decreased balance;Decreased mobility;Decreased knowledge of use of DME PT Treatment Interventions: DME instruction;Gait training;Stair training;Functional mobility training;Therapeutic activities;Therapeutic exercise;Balance training;Patient/family education   PT Goals Acute Rehab PT Goals PT Goal Formulation: With patient/family Time For Goal Achievement: 06/28/12 Potential to Achieve Goals: Good Pt will go Sit to Stand: with supervision PT Goal: Sit to Stand - Progress: Goal set today Pt will Ambulate: >150 feet;with supervision PT  Goal: Ambulate - Progress: Goal set today Pt will Go Up / Down Stairs: 3-5 stairs;with  min assist PT Goal: Up/Down Stairs - Progress: Goal set today  Visit Information  Last PT Received On: 06/14/12 Assistance Needed: +2 (due to medical events during session)    Subjective Data  Subjective: "I feel like everything just calapses in on me" Patient Stated Goal: to go home to her animals   Prior Functioning  Home Living Lives With: Spouse Available Help at Discharge: Family;Available 24 hours/day Type of Home: House Home Access: Stairs to enter [employer] of Steps: 2 Home Layout: Two level;Full bath on main level Bathroom Shower/Tub: Tub/shower unit;Walk-in shower Bathroom Toilet: Standard Home Adaptive Equipment: Tub transfer bench;Bedside commode/3-in-1 Prior Function Level of Independence: Independent Driving: Yes Vocation: Retired Dominant Hand: Right    Cognition  Cognition Arousal/Alertness: Awake/alert Behavior During Therapy: WFL for tasks assessed/performed Overall Cognitive Status: Within Functional Limits for tasks assessed    Extremity/Trunk Assessment Right Upper Extremity Assessment RUE ROM/Strength/Tone: WFL for tasks assessed RUE Sensation: WFL - Light Touch;WFL - Proprioception Left Upper Extremity Assessment LUE ROM/Strength/Tone: WFL for tasks assessed (transient weakness during episode of stroke like events) LUE Sensation: WFL - Light Touch;WFL - Proprioception Right Lower Extremity Assessment RLE ROM/Strength/Tone: WFL for tasks assessed RLE Sensation: WFL - Light Touch;WFL - Proprioception Left Lower Extremity Assessment LLE ROM/Strength/Tone: WFL for tasks assessed (transient weakness during episode of stroke like events) LLE Sensation: WFL - Light Touch;WFL - Proprioception Trunk Assessment Trunk Assessment: Normal   Balance    End of Session PT - End of Session Equipment Utilized During Treatment: Gait belt Activity Tolerance:  Treatment limited secondary to medical complications (Comment);Other (comment) (at conclusion of session pt became minimally responsive) Patient left: in bed;with call bell/phone within reach;with nursing in room;with family/visitor present;Other (comment) (doctor in room) Nurse Communication: Mobility status;Other (comment) (pt unresponsive episode; doctor paged and came to room)  GP     Fabio Asa 06/14/2012, 4:41 PM Charlotte Crumb, PT DPT  [phone]

## 2012-06-14 NOTE — Progress Notes (Signed)
Stroke Team Progress Note  HISTORY Erin Branch is an 79 y.o. female, right handed, with a past medical history significant for hypertension, remote breast cancer, brought to [facility] ED due to new onset unsteadiness, vertigo, impaired hearing, and left face droopiness.  Never had similar symptoms before, but 3 days ago developed sudden onset vertigo and decreased hearing and when she woke up this morning she was severely off balance to the degree that needed help from her husband in order to walk and avoid falling. Additionally, she was noted to have slurred speech and some droopiness of the left face.  Denies headache, double vision, difficulty swallowing, focal weakness or numbness, confusion.  Family reports a recent upper respiratory infection.  Upon arrival to ED she had a CT brain that did not show acute intracranial abnormality.   Date last known well: 3 days ago  Time last known well: uncertain.   Patient was not a TPA candidate secondary to delay in arrival. She was admitted for further evaluation and treatment.  SUBJECTIVE Her 2 daughters are at the bedside.  Overall she feels her condition is controlled. She is sitting up for the first time since she has arrived. Daughters report severely dysarthric speech. Patient lives with her husband who has had a previous stroke and has deficits per daughters at the bedside. The husband is their step-dad.  OBJECTIVE Most recent Vital Signs: Filed Vitals:   06/13/12 2200 06/14/12 0000 06/14/12 0200 06/14/12 0400  BP: 190/81 176/78 185/75 187/80  Pulse: 107 104 102 107  Temp:    97.7 F (36.5 C)  TempSrc:      Resp:    16  SpO2:    100%   CBG (last 3)   Recent Labs  06/13/12 0853  GLUCAP 123*   IV Fluid Intake:     MEDICATIONS  . aspirin EC  81 mg Oral Daily  . atorvastatin  80 mg Oral q1800  . enoxaparin (LOVENOX) injection  40 mg Subcutaneous Q24H  . sodium chloride  3 mL Intravenous Q12H   PRN:  sodium chloride, sodium  chloride  Diet:  Cardiac thin liquids Activity: OOB with assistance DVT Prophylaxis:  Lovenox 40 mg sq daily   CLINICALLY SIGNIFICANT STUDIES Basic Metabolic Panel:  Recent Labs Lab 06/13/12 0940 06/13/12 0951  NA 139 141  K 4.0 4.0  CL 103 107  CO2 24  --   GLUCOSE 119* 119*  BUN 13 13  CREATININE 0.80 0.90  CALCIUM 10.1  --    Liver Function Tests:  Recent Labs Lab 06/13/12 0940  AST 16  ALT 11  ALKPHOS 137*  BILITOT 0.2*  PROT 6.9  ALBUMIN 3.2*   CBC:  Recent Labs Lab 06/13/12 0940 06/13/12 0951  WBC 6.4  --   NEUTROABS 5.1  --   HGB 11.7* 11.6*  HCT 34.5* 34.0*  MCV 82.3  --   PLT 270  --    Coagulation:  Recent Labs Lab 06/13/12 0940  LABPROT 13.4  INR 1.03   Cardiac Enzymes:  Recent Labs Lab 06/13/12 0940  TROPONINI <0.30   Urinalysis:  Recent Labs Lab 06/13/12 0925  COLORURINE YELLOW  LABSPEC 1.009  PHURINE 7.5  GLUCOSEU NEGATIVE  HGBUR NEGATIVE  BILIRUBINUR NEGATIVE  KETONESUR NEGATIVE  PROTEINUR NEGATIVE  UROBILINOGEN 0.2  NITRITE NEGATIVE  LEUKOCYTESUR NEGATIVE   Lipid Panel    Component Value Date/Time   CHOL 251* 06/14/2012 0442   TRIG 189* 06/14/2012 0442   HDL 47 06/14/2012  0442   CHOLHDL 5.3 06/14/2012 0442   VLDL 38 06/14/2012 0442   LDLCALC 166* 06/14/2012 0442   HgbA1C  No results found for this basename: HGBA1C   Urine Drug Screen:   No results found for this basename: labopia, cocainscrnur, labbenz, amphetmu, thcu, labbarb    Alcohol Level: No results found for this basename: ETH,  in the last 168 hours  CT of the brain   06/13/2012    Scattered small acute infarcts noted within both cerebellar hemispheres and the left pons on recent MRI.  The appearance is most compatible with an embolic event.  The cerebellar infarcts are slightly better characterized on the current study; the left pontine infarct is only minimally visualized on CT.  No evidence of hemorrhagic transformation.   06/13/2012    No evidence of  acute intracranial abnormality.   MRI of the brain  06/13/2012    Note that the impression for the MRI of the brain should read: Small acute infarcts in the cerebellum bilaterally; small acute infarct in the left pons.    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  There is very little flow in the basilar which is diffusely diseased, accounting for acute bilateral cerebellar and left pontine infarcts  2D Echocardiogram    Carotid Doppler    CXR  06/13/2012   Left basilar scarring or atelectasis.  Mild hyperinflation.   EKG  normal sinus rhythm.   Therapy Recommendations   Physical Exam   Frail elderly petite Caucasian lady sitting up in bed.Awake alert. Afebrile. Head is nontraumatic. Neck is supple without bruit. Hearing is normal. Cardiac exam no murmur or gallop. Lungs are clear to auscultation. Distal pulses are well felt.  Neurological Exam : Awake alert oriented x 3 dysarthric speech extraocular movements are full range without nystagmus but mild saccadic dysmetric on left gaze  . Mild left lower face asymmetry. Tongue midline. No drift. Mild diminished fine finger movements on left. Orbits right over left upper extremity. Mild left grip weak.. Normal sensation . Mildly impaired left finger-to-nose and knee to heel coordination. Gait deferred ASSESSMENT Erin Branch is a 79 y.o. female presenting with ataxia, vertigo, decreased hearing, and left face weakness.  Imaging confirms bilateral cerebellar and a small left pontine infarct. 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 Now on aspirin 325 mg orally every day for secondary stroke prevention. Patient with resultant ataxia, dysathrtia, dysphagia, vertigo, left face hemiparesis. Work up underway.  Hypertension Hyperlipidemia, LDL 166, on no statin PTA, now on lipitor 80 mg daily, goal LDL <  100 (< 70 for diabetics)' Dysphagia, coughing with food intake this am. Hx breast cancer  Hospital day # 1  TREATMENT/PLAN  Given severely stenosis/hypoplastic posterior circulation, recommend aspirin 81 mg orally every day and clopidogrel 75 mg orally every day for secondary stroke prevention x 3 mos then one alone  Stay hydrated - recommend IVF  Given dysphagia and worsening dysarthria, make pt NPO. Have ST reassess swallow.  F/u 2D, carotid doppler  OOB. Therapy evals D/W family and answered questions Dr. Pearlean Brownie discussed diagnosis, prognosis,  treatment options and plan of care with  Medical student.    Annie Main, MSN, RN, ANVP-BC, ANP-BC, [facility] Pager: [phone] 06/14/2012 8:47 AM  I have personally obtained a history, examined the patient, evaluated imagi2ng results,  and formulated the assessment and plan of care. I agree with the above.  Delia Heady, MD

## 2012-06-14 NOTE — H&P (Signed)
Internal Medicine Attending Admission Note Date: 06/14/2012  Patient name: Erin Branch Medical record number: [id number] Date of birth: 27-Aug-1933 Age: 79 y.o. Gender: female  I saw and evaluated the patient. I reviewed the resident's note and I agree with the resident's findings and plan as documented in the resident's note.  Chief Complaint(s): Dizziness and difficulty walking  History - key components related to admission: Patient is a 79 year old female with past medical history most significant for hypertension, reflux disease, breast cancer status post lumpectomy who was admitted from noted to have dizziness and difficulty walking on the morning of admission. Apparently patient has been having problems with dizziness and her balance since last one week but was noted to have slurring of speech and dysarthria on the morning of admission. The son claims to have noted a left-sided facial droop and hence called EMS. Patient was initially brought in as a code stroke which was cancelled due to late presentation.  Patient denied any headache, double vision, difficulty swallowing, focal weakness or numbness, confusion. The patient has been recovering from a recent upper respiratory tract infection. GI evaluation including MRI was suggestive of multiple acute posterior circulation strokes.  16 point review of systems was negative except what is noted above.  Past medical history, past surgical history, medications, family history and social history was reviewed and is as per resident's note.  Physical Exam - key components related to admission:  Filed Vitals:   06/14/12 0000 06/14/12 0200 06/14/12 0400 06/14/12 0800  BP: 176/78 185/75 187/80 190/71  Pulse: 104 102 107 97  Temp:   97.7 F (36.5 C) 97.7 F (36.5 C)  TempSrc:    Oral  Resp:   16 18  SpO2:   100% 100%  Physical Exam: General: Vital signs reviewed and noted. Well-developed, well-nourished, in no acute distress; alert, appropriate  and cooperative throughout examination.  Head: Normocephalic, atraumatic.  Eyes: PERRL, EOMI, No signs of anemia or jaundince.  Nose: Mucous membranes moist, not inflammed, nonerythematous.  Throat: Oropharynx nonerythematous, no exudate appreciated.   Neck: No deformities, masses, or tenderness noted.Supple, No carotid Bruits, no JVD.  Lungs:  Normal respiratory effort. Clear to auscultation BL without crackles or wheezes.  Heart: RRR. S1 and S2 normal without gallop, murmur, or rubs.  Abdomen:  BS normoactive. Soft, Nondistended, non-tender.  No masses or organomegaly.  Extremities: No pretibial edema.  Neurologic: A&O X3, CN II - XII are grossly intact. Motor strength is 5/5 in right extremities and 4+/5 on left extremities, Sensations intact to light touch, Cerebellar signs grossly normal. Unable to test GAIT  Skin: No visible rashes, scars.    Lab results: Basic Metabolic Panel:  Recent Labs  25/36/64 0940 06/13/12 0951  NA 139 141  K 4.0 4.0  CL 103 107  CO2 24  --   GLUCOSE 119* 119*  BUN 13 13  CREATININE 0.80 0.90  CALCIUM 10.1  --    Liver Function Tests:  Recent Labs  06/13/12 0940  AST 16  ALT 11  ALKPHOS 137*  BILITOT 0.2*  PROT 6.9  ALBUMIN 3.2*    Recent Labs  06/13/12 1045  AMMONIA 20   CBC:  Recent Labs  06/13/12 0940 06/13/12 0951  WBC 6.4  --   NEUTROABS 5.1  --   HGB 11.7* 11.6*  HCT 34.5* 34.0*  MCV 82.3  --   PLT 270  --    Cardiac Enzymes:  Recent Labs  06/13/12 0940  TROPONINI <0.30  CBG:  Recent Labs  06/13/12 0853  GLUCAP 123*   Fasting Lipid Panel:  Recent Labs  06/14/12 0442  CHOL 251*  HDL 47  LDLCALC 166*  TRIG 189*  CHOLHDL 5.3   Coagulation:  Recent Labs  06/13/12 0940  INR 1.03   Imaging results:  Dg Chest 2 View  06/13/2012   *RADIOLOGY REPORT*  Clinical Data: Dizziness.  CHEST - 2 VIEW  Comparison: 05/05/2004  Findings: Mild hyperinflation of the lungs.  Linear scarring or atelectasis at  the left base.  Right lung is clear.  No effusions or acute bony abnormality.  IMPRESSION: Left basilar scarring or atelectasis.  Mild hyperinflation.   Original Report Authenticated By: Charlett Nose, M.D.   Ct Head Wo Contrast  06/13/2012   *RADIOLOGY REPORT*  Clinical Data: Acute left pontine infarct.  Persistent somnolence. Assess for progression of infarct.  CT HEAD WITHOUT CONTRAST  Technique:  Contiguous axial images were obtained from the base of the skull through the vertex without contrast.  Comparison: CT of the head and MRI of the brain performed earlier today at 10:23 a.m. and 03:55 p.m.  Findings:   Scattered small acute infarcts were noted on MRI within both cerebellar hemispheres and the left pons; the appearance is most compatible with an embolic event.  The cerebellar infarcts are slightly better characterized on the current study.  The left pontine infarct is only minimally visualized.  There is no evidence of hemorrhagic transformation.  No mass effect is seen.  Minimal periventricular white matter change likely reflects small vessel ischemic microangiopathy.  A prominent focus of calcification is noted at the left globus pallidus.  The third and lateral ventricles, and basal ganglia are unremarkable in appearance.  The cerebral hemispheres are symmetric in appearance, with normal gray-white differentiation.  No mass effect or midline shift is seen.  There is no evidence of fracture; visualized osseous structures are unremarkable in appearance.  The visualized portions of the orbits are within normal limits.  The paranasal sinuses and mastoid air cells are well-aerated.  No significant soft tissue abnormalities are seen.  IMPRESSION: Scattered small acute infarcts noted within both cerebellar hemispheres and the left pons on recent MRI.  The appearance is most compatible with an embolic event.  The cerebellar infarcts are slightly better characterized on the current study; the left pontine  infarct is only minimally visualized on CT.  No evidence of hemorrhagic transformation.   Original Report Authenticated By: Tonia Ghent, M.D.   Ct Head Wo Contrast  06/13/2012   *RADIOLOGY REPORT*  Clinical Data: Dizziness, slurred speech, left facial droop  CT HEAD WITHOUT CONTRAST  Technique:  Contiguous axial images were obtained from the base of the skull through the vertex without contrast.  Comparison: None.  Findings: No evidence of parenchymal hemorrhage or extra-axial fluid collection. No mass lesion, mass effect, or midline shift.  No CT evidence of acute infarction.  Mild subcortical white matter and periventricular small vessel ischemic changes.  Intracranial atherosclerosis.  Cerebral volume is age appropriate.  No ventriculomegaly.  The visualized paranasal sinuses are essentially clear. The mastoid air cells are unopacified.  No evidence of calvarial fracture.  IMPRESSION: No evidence of acute intracranial abnormality.   Original Report Authenticated By: Charline Bills, M.D.   Mr [facility] Wo Contrast  06/13/2012   **ADDENDUM** CREATED: 06/13/2012 22:48:43  Note that the impression for the MRI of the brain should read: Small acute infarcts in the cerebellum bilaterally; small acute infarct in the  left pons.  **END ADDENDUM** SIGNED BY: Tonia Ghent, M.D.  06/13/2012   *RADIOLOGY REPORT*  Clinical Data:  Dizziness.  Slurred speech.  MRI HEAD WITHOUT CONTRAST MRA HEAD WITHOUT CONTRAST  Technique:  Multiplanar, multiecho pulse sequences of the brain and surrounding structures were obtained without intravenous contrast. Angiographic images of the head were obtained using MRA technique without contrast.  Comparison:  CT head 06/13/2012  MRI HEAD  Findings:  Multiple small areas of acute infarct in the cerebellum bilaterally.  Small acute infarct in the left pons.  No acute infarct above the tentorium.  Ventricle size is normal.  Minimal chronic microvascular ischemic change in the cerebral white  matter.  Negative for hemorrhage or mass lesion.  Increased CSF over the convexity felt to be due to parietal atrophy. Asymmetric calcification left basal ganglia.  IMPRESSION: Chronic infarcts in the cerebellum bilaterally.  Acute infarct left pons.  MRA HEAD  Findings: Distal vertebral artery is diffusely diseased and ends in pica bilaterally.  Vertebral arteries do not appear to contribute to the basilar.  There is minimal flow in the mid basilar which appears diffusely diseased.  This is most likely fed by collateral circulation.  There is fetal origin of the posterior cerebral artery bilaterally which are widely patent.  Cerebellar arteries are not well seen likely due to slow flow and/or occlusion.  Internal carotid artery is patent bilaterally without significant stenosis.  Anterior  and middle cerebral arteries are patent bilaterally.  There is mild atherosclerotic disease involving middle cerebral artery branches bilaterally.  No aneurysm is identified.  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  There is very little flow in the basilar which is diffusely diseased, accounting for acute bilateral cerebellar and left pontine infarcts.  Original Report Authenticated By: Janeece Riggers, M.D.   Mr Brain Wo Contrast  06/13/2012   **ADDENDUM** CREATED: 06/13/2012 22:48:43  Note that the impression for the MRI of the brain should read: Small acute infarcts in the cerebellum bilaterally; small acute infarct in the left pons.  **END ADDENDUM** SIGNED BY: Tonia Ghent, M.D.  06/13/2012   *RADIOLOGY REPORT*  Clinical Data:  Dizziness.  Slurred speech.  MRI HEAD WITHOUT CONTRAST MRA HEAD WITHOUT CONTRAST  Technique:  Multiplanar, multiecho pulse sequences of the brain and surrounding structures were obtained without intravenous contrast. Angiographic images of the head were obtained using MRA technique without contrast.  Comparison:  CT head 06/13/2012  MRI HEAD  Findings:  Multiple small areas  of acute infarct in the cerebellum bilaterally.  Small acute infarct in the left pons.  No acute infarct above the tentorium.  Ventricle size is normal.  Minimal chronic microvascular ischemic change in the cerebral white matter.  Negative for hemorrhage or mass lesion.  Increased CSF over the convexity felt to be due to parietal atrophy. Asymmetric calcification left basal ganglia.  IMPRESSION: Chronic infarcts in the cerebellum bilaterally.  Acute infarct left pons.  MRA HEAD  Findings: Distal vertebral artery is diffusely diseased and ends in pica bilaterally.  Vertebral arteries do not appear to contribute to the basilar.  There is minimal flow in the mid basilar which appears diffusely diseased.  This is most likely fed by collateral circulation.  There is fetal origin of the posterior cerebral artery bilaterally which are widely patent.  Cerebellar arteries are not well seen likely due to slow flow and/or occlusion.  Internal carotid artery is patent bilaterally without significant stenosis.  Anterior  and middle cerebral arteries  are patent bilaterally.  There is mild atherosclerotic disease involving middle cerebral artery branches bilaterally.  No aneurysm is identified.  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  There is very little flow in the basilar which is diffusely diseased, accounting for acute bilateral cerebellar and left pontine infarcts.  Original Report Authenticated By: Janeece Riggers, M.D.    Other results: EKG: Sinus rhythm, 62 beats per minute, normal axis, prolonged QTC at 467, nonspecific T wave changes, no prior EKGs in the system  Assessment & Plan by Problem:  Principal Problem:   Acute embolic stroke Active Problems:   Hypertension  patient is a 79 year old female with past medical history most significant for hypertension, reflux disease, breast cancer status post lumpectomy who presented with dizziness and dysarthria and facial droop since the  morning of admission. The patient has acute posterior circulation stroke in multiple locations including bilateral occlusion of distal vertebral artery. Secondary prevention of stroke will include starting the patient on aspirin plus Plavix (would appreciate neurology input if they think otherwise), blood pressure control, high-dose statins, smoking cessation. We would also order carotid Dopplers, echocardiogram specially transesophageal to look for causes which can be fixed surgically (although there needs to be another discussion regarding that based on the procedure required) to avoid any future strokes. We will have patient seen and evaluated by inpatient rehabilitation for possible transfer.  Rest of the medical management as per resident's note.  Lars Mage MD Faculty-Internal Medicine Residency Program

## 2012-06-14 NOTE — Progress Notes (Addendum)
Called to the bedside due to sudden onset left sided weakness and confusion with worsening slurred speech that occurred while she was working with PT. Her symptoms seemed to resolve while I was on the phone with nursing. I called Neurology who is also following the patient, and spoke to [facility], who recommended laying the pt flat in the bed, except to use the restroom, and starting 500cc NS with 75cc maintenance IVF to keep her hydrated. She said that she would contact Dr. Pearlean Brownie regarding further intervention.  Upon entering the patient's room, she was sitting up in a chair, alert and oriented x3 with resolution of her left sided weakness, no facial droop was noted either. PT was in the room and helped to get the pt into bed. While getting her in bed, she became somewhat limp and her speech became slurred. She remained oriented x3 and did not have facial drooping, but did have mild left sided weakness 4/5 vs her right side 5/5. After 2-3 minutes, her symptoms resolved, and she was speaking much clearer and her weakness resolved.   Her family is upset watching these recurrent TIA, but they believe that each one is less severe than the previous.   I did discuss the ECHO and carotid doppler results with her family at this time, and told them that Neurology is aware of the TIAs.   She was laid flat in the bed and IVF were started before I left the room.   Addendum: 5:20pm Spoke with Dr. Amada Jupiter and we are to continue to allow for permissive hypertension given her TIAs. Continue ASA and Plavix. The pt is to be on complete bed rest until she can be evaluated by Dr. Pearlean Brownie tomorrow morning. Transferring to SDU as well.   Spoke with the nurse and she is aware.

## 2012-06-14 NOTE — ED Provider Notes (Signed)
I saw and evaluated the patient, reviewed the resident's note and I agree with the findings and plan.  The patient presents to the ED with complaints of dizziness for several days, woke this AM with left facial droop, difficulty speaking.  There was no injury or trauma.  She went to bed last night around 9PM.  This was the last time she was seen at her baseline.  On exam, she is afebrile and the vitals are stable.  The heart is regular rate and rhythm without murmurs and the lungs have slight rhonchi bilaterally.  The abdomen is benign.  Neurologically, a left-sided facial droop is noted but cranial nerves are otherwise intact.  Her speech is slow and slurred.  There is a rightward nystagmus noted.  Finger to nose is within normal limits.    The patient arrived with stroke-like symptoms, however she was last seen at baseline last night at 9 PM.  Therefore, no code stroke was initiated.  Workup reveals a negative head ct, unremarkable labs. Neurology has been consulted.  They have recommended mri and admission to medicine.  They will consult on the patient.  Medicine to admit.  Geoffery Lyons, MD 06/14/12 [phone]

## 2012-06-15 DIAGNOSIS — I651 Occlusion and stenosis of basilar artery: Secondary | ICD-10-CM

## 2012-06-15 LAB — COMPREHENSIVE METABOLIC PANEL
ALT: 9 U/L (ref 0–35)
BUN: 15 mg/dL (ref 6–23)
CO2: 28 mEq/L (ref 19–32)
Calcium: 10.2 mg/dL (ref 8.4–10.5)
Creatinine, Ser: 0.83 mg/dL (ref 0.50–1.10)
GFR calc Af Amer: 77 mL/min — ABNORMAL LOW (ref >90)
GFR calc non Af Amer: 66 mL/min — ABNORMAL LOW (ref >90)
Glucose, Bld: 102 mg/dL — ABNORMAL HIGH (ref 70–99)
Sodium: 139 mEq/L (ref 135–145)
Total Protein: 6.6 g/dL (ref 6.0–8.3)

## 2012-06-15 LAB — CBC
HCT: 34.6 % — ABNORMAL LOW (ref 36.0–46.0)
Hemoglobin: 11.3 g/dL — ABNORMAL LOW (ref 12.0–15.0)
RBC: 4.19 MIL/uL (ref 3.87–5.11)
WBC: 5.8 10*3/uL (ref 4.0–10.5)

## 2012-06-15 MED ORDER — POTASSIUM CHLORIDE CRYS ER 20 MEQ PO TBCR
20.0000 meq | EXTENDED_RELEASE_TABLET | Freq: Once | ORAL | Status: AC
  Administered 2012-06-15: 20 meq via ORAL
  Filled 2012-06-15: qty 1

## 2012-06-15 MED ORDER — POTASSIUM CHLORIDE 10 MEQ/100ML IV SOLN
10.0000 meq | INTRAVENOUS | Status: AC
  Administered 2012-06-15 (×2): 10 meq via INTRAVENOUS
  Filled 2012-06-15: qty 300

## 2012-06-15 NOTE — Progress Notes (Signed)
Pt IV beeping, upon entering room RN OBS pt up to [facility] with aide of family, pt and family educated possible risk of increased ICP and BP with HOB up grater than 30 degrees, standing or sitting positions, sys BP 195, nursing will cont to monitor

## 2012-06-15 NOTE — Evaluation (Signed)
Clinical/Bedside Swallow Evaluation Patient Details  Name: Erin Branch MRN: [id number] Date of Birth: Feb 24, 1933  Today's Date: 06/15/2012 Time:  -     Past Medical History:  Past Medical History  Diagnosis Date  . Hypertension   . Carcinoma of breast, stage 1 2006    s/p Right partial mastectomy   Past Surgical History:  Past Surgical History  Procedure Laterality Date  . Partial mastectomy Right 04/2004    Rose Phi. Young, M.D.   . Breast surgery     HPI:  79 y.o. female with a PMH significant for hypertension, brought to [facility] ED with new acute onset dysarthria, ataxia, vertigo, and decreased hearing. Neuro-exam significant for dysarthria, mild left face weakness, ataxia, and decreased hearing. 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 BSE completed yesterday, pt WNL with mild ataxic dysarthria. SLP signed off. THen pt with TIA later in day. PO's held, pt instructed to lay flat. SLP ordered a second time to reassess. Dr. Pearlean Brownie gave ok for pt to sit up for meals only.    Assessment / Plan / Recommendation Clinical Impression  Again, pt demonstrates function WNL. No evidence of aspiration or dysphagia. Mild ataxic dysarthria persists. Pt is safe to continue regular consistency, heart healthy diet. SLP will follow via chart to ensure tolerance as pts function is not stable. Will place sign with precautions and discuss with MD.     Aspiration Risk  Mild    Diet Recommendation Regular;Thin liquid   Liquid Administration via: Cup;Straw Medication Administration: Whole meds with liquid Supervision: Patient able to self feed Postural Changes and/or Swallow Maneuvers: Seated upright 90 degrees (pt must lay flat when not eating/drinking. )    Other  Recommendations     Follow Up Recommendations  Outpatient SLP (for dysarthria)    Frequency and Duration min 1 x/week  2 weeks   Pertinent  Vitals/Pain NA    SLP Swallow Goals Patient will utilize recommended strategies during swallow to increase swallowing safety with: Independent assistance Swallow Study Goal #2 - Progress: Progressing toward goal   Swallow Study Prior Functional Status       General HPI: 79 y.o. female with a PMH significant for hypertension, brought to [facility] ED with new acute onset dysarthria, ataxia, vertigo, and decreased hearing. Neuro-exam significant for dysarthria, mild left face weakness, ataxia, and decreased hearing. 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 BSE completed yesterday, pt WNL with mild ataxic dysarthria. SLP signed off. THen pt with TIA later in day. PO's held, pt instructed to lay flat. SLP ordered a second time to reassess. Dr. Pearlean Brownie gave ok for pt to sit up for meals only.  Type of Study: Bedside swallow evaluation Diet Prior to this Study: NPO Temperature Spikes Noted: No Respiratory Status: Room air History of Recent Intubation: No Behavior/Cognition: Alert;Cooperative;Pleasant mood Oral Cavity - Dentition: Adequate natural dentition Self-Feeding Abilities: Able to feed self Patient Positioning: Upright in bed Baseline Vocal Quality: Clear Volitional Cough: Strong Volitional Swallow: Able to elicit    Oral/Motor/Sensory Function Overall Oral Motor/Sensory Function: Other (comment) (slow movement/coordination)   Ice Chips     Thin Liquid Thin Liquid: Within functional limits    Nectar Thick Nectar Thick Liquid: Not tested   Honey Thick Honey Thick Liquid: Not tested   Puree Puree: Within functional limits   Solid  GO    Solid: Within functional limits      [facility], [place] CCC-SLP [phone]  Claudine Mouton 06/15/2012,9:53 AM

## 2012-06-15 NOTE — Progress Notes (Signed)
PT Cancellation Note  Patient Details Name: KALEESI GUYTON MRN: [id number] DOB: 06-22-1933   Cancelled Treatment:    Reason Eval/Treat Not Completed: Other (comment) noted order remains for bedrest and Stroke MD note indicates to keep in bed today.  Will try again tomorrow.   WYNN,CYNDI 06/15/2012, 3:42 PM

## 2012-06-15 NOTE — Progress Notes (Signed)
Stroke Team Progress Note  HISTORY Erin Branch is an 79 y.o. female, right handed, with a past medical history significant for hypertension, remote breast cancer, brought to [facility] ED due to new onset unsteadiness, vertigo, impaired hearing, and left face droopiness.  Never had similar symptoms before, but 3 days ago developed sudden onset vertigo and decreased hearing and when she woke up this morning she was severely off balance to the degree that needed help from her husband in order to walk and avoid falling. Additionally, she was noted to have slurred speech and some droopiness of the left face.  Denies headache, double vision, difficulty swallowing, focal weakness or numbness, confusion.  Family reports a recent upper respiratory infection.  Upon arrival to ED she had a CT brain that did not show acute intracranial abnormality.   Date last known well: 3 days ago  Time last known well: uncertain.   Patient was not a TPA candidate secondary to delay in arrival. She was admitted for further evaluation and treatment.  SUBJECTIVE Patient with waxing and waning symptoms yesterday afternoon and into the evening. Transferred to step dowon for monitoring. Patient lying flat. 2 daughters at the bedside.  OBJECTIVE Most recent Vital Signs: Filed Vitals:   06/14/12 1810 06/14/12 2000 06/15/12 0000 06/15/12 0400  BP:  182/87 186/62 166/75  Pulse:  65 63 65  Temp: 98.4 F (36.9 C) 98.4 F (36.9 C) 98.2 F (36.8 C) 98.8 F (37.1 C)  TempSrc: Oral Oral Oral Oral  Resp:  23 19 22   SpO2:  95% 96% 95%   CBG (last 3)   Recent Labs  06/13/12 0853  GLUCAP 123*   IV Fluid Intake:     MEDICATIONS  . aspirin EC  81 mg Oral Daily  . atorvastatin  80 mg Oral q1800  . clopidogrel  75 mg Oral Q breakfast  . enoxaparin (LOVENOX) injection  40 mg Subcutaneous Q24H  . potassium chloride  10 mEq Intravenous Q1 Hr x 3  . sodium chloride  500 mL Intravenous Once  . sodium chloride  3 mL Intravenous  Q12H   PRN:  sodium chloride, sodium chloride  Diet:  NPO  Activity: OOB with assistance DVT Prophylaxis:  Lovenox 40 mg sq daily   CLINICALLY SIGNIFICANT STUDIES Basic Metabolic Panel:   Recent Labs Lab 06/13/12 0940 06/13/12 0951 06/15/12 0405  NA 139 141 139  K 4.0 4.0 3.2*  CL 103 107 102  CO2 24  --  28  GLUCOSE 119* 119* 102*  BUN 13 13 15   CREATININE 0.80 0.90 0.83  CALCIUM 10.1  --  10.2   Liver Function Tests:   Recent Labs Lab 06/13/12 0940 06/15/12 0405  AST 16 15  ALT 11 9  ALKPHOS 137* 127*  BILITOT 0.2* 0.5  PROT 6.9 6.6  ALBUMIN 3.2* 3.1*   CBC:   Recent Labs Lab 06/13/12 0940 06/13/12 0951 06/15/12 0405  WBC 6.4  --  5.8  NEUTROABS 5.1  --   --   HGB 11.7* 11.6* 11.3*  HCT 34.5* 34.0* 34.6*  MCV 82.3  --  82.6  PLT 270  --  273   Coagulation:   Recent Labs Lab 06/13/12 0940  LABPROT 13.4  INR 1.03   Cardiac Enzymes:   Recent Labs Lab 06/13/12 0940  TROPONINI <0.30   Urinalysis:   Recent Labs Lab 06/13/12 0925  COLORURINE YELLOW  LABSPEC 1.009  PHURINE 7.5  GLUCOSEU NEGATIVE  HGBUR NEGATIVE  BILIRUBINUR NEGATIVE  KETONESUR NEGATIVE  PROTEINUR NEGATIVE  UROBILINOGEN 0.2  NITRITE NEGATIVE  LEUKOCYTESUR NEGATIVE   Lipid Panel    Component Value Date/Time   CHOL 251* 06/14/2012 0442   TRIG 189* 06/14/2012 0442   HDL 47 06/14/2012 0442   CHOLHDL 5.3 06/14/2012 0442   VLDL 38 06/14/2012 0442   LDLCALC 166* 06/14/2012 0442   HgbA1C  Lab Results  Component Value Date   HGBA1C 5.5 06/14/2012   Urine Drug Screen:   No results found for this basename: labopia,  cocainscrnur,  labbenz,  amphetmu,  thcu,  labbarb    Alcohol Level: No results found for this basename: ETH,  in the last 168 hours  CT of the brain   06/13/2012    Scattered small acute infarcts noted within both cerebellar hemispheres and the left pons on recent MRI.  The appearance is most compatible with an embolic event.  The cerebellar infarcts are  slightly better characterized on the current study; the left pontine infarct is only minimally visualized on CT.  No evidence of hemorrhagic transformation.   06/13/2012    No evidence of acute intracranial abnormality.   MRI of the brain  06/13/2012    Note that the impression for the MRI of the brain should read: Small acute infarcts in the cerebellum bilaterally; small acute infarct in the left pons.    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  There is very little flow in the basilar which is diffusely diseased, accounting for acute bilateral cerebellar and left pontine infarcts  2D Echocardiogram  EF 60-65% with no source of embolus. No significant valvular abnormalities  Carotid Doppler  No evidence of hemodynamically significant internal carotid artery stenosis. Vertebral artery flow is antegrade.   CXR  06/13/2012   Left basilar scarring or atelectasis.  Mild hyperinflation.   EKG  normal sinus rhythm.   Therapy Recommendations home health PT and OT  Physical Exam   Frail elderly petite Caucasian lady sitting up in bed.Awake alert. Afebrile. Head is nontraumatic. Neck is supple without bruit. Hearing is normal. Cardiac exam no murmur or gallop. Lungs are clear to auscultation. Distal pulses are well felt.  Neurological Exam : Awake alert oriented x 3 dysarthric speech extraocular movements are full range without nystagmus but mild saccadic dysmetric on left gaze  . Mild left lower face asymmetry. Tongue midline. No drift. Mild diminished fine finger movements on left. Orbits right over left upper extremity. Mild left grip weak.. Normal sensation . Mildly impaired left finger-to-nose and knee to heel coordination. Gait deferred  ASSESSMENT Erin Branch is a 79 y.o. female presenting with ataxia, vertigo, decreased hearing, and left face weakness.  Imaging confirms bilateral cerebellar and a small left pontine infarct. 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 Waxing and waning of symptoms 06/14/2012 - 1230, 330, 340, 530, each lasting around 2-3 mins- described as eyes glazed over and unresponsive by dtrs - transferred to the ICU. Patient is at risk for neuro worsening given baseline severe posterior circulation disease.  On no antithrombotics prior to admission. Now on aspirin 81 mg orally every day and clopidogrel 75 mg orally every day for secondary stroke prevention, though pt currently NPO. Patient with resultant ataxia, dysathrtia, dysphagia, vertigo, left face hemiparesis.   Hypertension Hyperlipidemia, LDL 166, on no statin PTA, now on lipitor 80 mg daily, goal LDL < 100 (< 70 for diabetics)' Dysphagia,  coughing with food intake 5/28. Cleared by ST in the afternoon for reg diet, thin liquid Hx breast cancer  Hospital day # 2  TREATMENT/PLAN  Given severely stenosis/hypoplastic posterior circulation, recommend continuation of aspirin 81 mg orally every day and clopidogrel 75 mg orally every day for secondary stroke prevention x 3 mos then one alone  Continue hydration  ST to reassess swallow again. Resume diet per ST  Keep in bed today, but can sit up, prefer to keep as flat as possible  D/W daughters, pt and answered questions x 15 minutes Dr. Pearlean Brownie discussed diagnosis, prognosis,  treatment options and plan of care with Dr. Sherrine Maples.  Annie Main, MSN, RN, ANVP-BC, ANP-BC, [facility] Pager: [phone] 06/15/2012 8:26 AM  I have personally obtained a history, examined the patient, evaluated imagi2ng results, and formulated the assessment and plan of care. I agree with the above. Delia Heady, MD

## 2012-06-15 NOTE — Progress Notes (Signed)
Pt frequently voiding small amounts in bedpan.  Pt complaining of lower abdominal pain at 0130.  Bladder scan showed 820 mL.  Pt tried to use bedpan, voided 50 mL clear yellow urine.  Bladder scan= 760 mL.  MD on call notified.  Order placed for in and out cath Q8hrs.  In and out cath done- clear yellow urine out.  Will continue to monitor.    Maximino Greenland RN

## 2012-06-15 NOTE — Progress Notes (Signed)
Internal Medicine Teaching Service Attending Note Date: 06/15/2012  Patient name: Erin Branch  Medical record number: [id number]  Date of birth: February 17, 1933    This patient has been seen and discussed with the house staff. Please see their note for complete details. I concur with their findings with the following additions/corrections:  Spoke with Erin Branch and family at length this morning.  Main concerns included patient wanting to eat and get up today.  We discussed the reasons for bed rest and lying flat when possible.  Diet is ordered and patient can sit up for meals.  She is at risk for further intracranial pathology given her findings on MRI/MRA and recent fluctuating neurological status.    Closely monitor in stepdown unit with frequent neuro checks today.  Neurology is following along with [place].   [name] 06/15/2012, 3:05 PM

## 2012-06-15 NOTE — Progress Notes (Signed)
Medical Student Daily Progress Note  Subjective: 3 episodes of unresponsiveness, lethargy, with L sided weakness overnight, thought to be TIAs.  One occurred during PT eval whereas others occurred while sitting up in bed.  Each episode lasted approximately 5 minutes..  Improved with placement into supine position.  Decision was made to keep pt NPO and leave in supine position overnight.  This AM, pt reports she wants to sit up and eat.  She c/o pain in L hand PIV 2/2 K replacement.  Denies dizziness.  No complaints otherwise.   Objective: Vital signs in last 24 hours: Filed Vitals:   06/14/12 2000 06/15/12 0000 06/15/12 0400 06/15/12 0800  BP: 182/87 186/62 166/75   Pulse: 65 63 65 60  Temp: 98.4 F (36.9 C) 98.2 F (36.8 C) 98.8 F (37.1 C)   TempSrc: Oral Oral Oral   Resp: 23 19 22 21   SpO2: 95% 96% 95% 98%   Weight change:   Intake/Output Summary (Last 24 hours) at 06/15/12 0852 Last data filed at 06/15/12 0700  Gross per 24 hour  Intake    975 ml  Output    900 ml  Net     75 ml    Physical Exam: BP 166/75  Pulse 60  Temp(Src) 98.8 F (37.1 C) (Oral)  Resp 21  SpO2 98% General appearance: alert, cooperative and appears older than stated age. Lying flat in bed. Head: Normocephalic, without obvious abnormality, atraumatic  Eyes: PERRL. EOMI with horizontal nystagmus when looking right. Throat: MMM  Lungs: clear to auscultation bilaterally. [place] in place.  Heart: regular rate and rhythm, S1, S2 normal, no murmur, click, rub or gallop  Abdomen: soft, non-tender; bowel sounds normal; no masses, no organomegaly  Extremities:  Otherwise extremities normal, atraumatic, no cyanosis, no edema. Pulses: 2+ and symmetric  Neurologic: minimally slurred speech. Requires raised voice to hear questions. Uvula midline.  Tongue deviates R. Otherwise CN II-XII intact. Slow rapid-hand-movement BL. Difficulty with finger-to-nose. Gait was not examined.    Lab Results: Basic Metabolic  Panel:  Recent Labs  06/13/12 0940 06/13/12 0951 06/15/12 0405  NA 139 141 139  K 4.0 4.0 3.2*  CL 103 107 102  CO2 24  --  28  GLUCOSE 119* 119* 102*  BUN 13 13 15   CREATININE 0.80 0.90 0.83  CALCIUM 10.1  --  10.2   Liver Function Tests:  Recent Labs  06/13/12 0940 06/15/12 0405  AST 16 15  ALT 11 9  ALKPHOS 137* 127*  BILITOT 0.2* 0.5  PROT 6.9 6.6  ALBUMIN 3.2* 3.1*   No results found for this basename: LIPASE, AMYLASE,  in the last 72 hours  Recent Labs  06/13/12 1045  AMMONIA 20   CBC:  Recent Labs  06/13/12 0940 06/13/12 0951 06/15/12 0405  WBC 6.4  --  5.8  NEUTROABS 5.1  --   --   HGB 11.7* 11.6* 11.3*  HCT 34.5* 34.0* 34.6*  MCV 82.3  --  82.6  PLT 270  --  273   Cardiac Enzymes:  Recent Labs  06/13/12 0940  TROPONINI <0.30   BNP: No results found for this basename: PROBNP,  in the last 72 hours D-Dimer: No results found for this basename: DDIMER,  in the last 72 hours CBG:  Recent Labs  06/13/12 0853  GLUCAP 123*   Hemoglobin A1C:  Recent Labs  06/14/12 0442  HGBA1C 5.5   Fasting Lipid Panel:  Recent Labs  06/14/12 0442  CHOL 251*  HDL 47  LDLCALC 166*  TRIG 189*  CHOLHDL 5.3   Thyroid Function Tests: No results found for this basename: TSH, T4TOTAL, FREET4, T3FREE, THYROIDAB,  in the last 72 hours Anemia Panel: No results found for this basename: VITAMINB12, FOLATE, FERRITIN, TIBC, IRON, RETICCTPCT,  in the last 72 hours Coagulation:  Recent Labs  06/13/12 0940  LABPROT 13.4  INR 1.03   Urine Drug Screen: Drugs of Abuse  No results found for this basename: labopia, cocainscrnur, labbenz, amphetmu, thcu, labbarb    Alcohol Level: No results found for this basename: ETH,  in the last 72 hours Urinalysis:  Recent Labs  06/13/12 0925  COLORURINE YELLOW  LABSPEC 1.009  PHURINE 7.5  GLUCOSEU NEGATIVE  HGBUR NEGATIVE  BILIRUBINUR NEGATIVE  KETONESUR NEGATIVE  PROTEINUR NEGATIVE  UROBILINOGEN 0.2   NITRITE NEGATIVE  LEUKOCYTESUR NEGATIVE    Micro Results: No results found for this or any previous visit (from the past 240 hour(s)).  Studies/Results: Dg Chest 2 View  06/13/2012   *RADIOLOGY REPORT*  Clinical Data: Dizziness.  CHEST - 2 VIEW  Comparison: 05/05/2004  Findings: Mild hyperinflation of the lungs.  Linear scarring or atelectasis at the left base.  Right lung is clear.  No effusions or acute bony abnormality.  IMPRESSION: Left basilar scarring or atelectasis.  Mild hyperinflation.   Original Report Authenticated By: Charlett Nose, M.D.   Ct Head Wo Contrast  06/13/2012   *RADIOLOGY REPORT*  Clinical Data: Acute left pontine infarct.  Persistent somnolence. Assess for progression of infarct.  CT HEAD WITHOUT CONTRAST  Technique:  Contiguous axial images were obtained from the base of the skull through the vertex without contrast.  Comparison: CT of the head and MRI of the brain performed earlier today at 10:23 a.m. and 03:55 p.m.  Findings:   Scattered small acute infarcts were noted on MRI within both cerebellar hemispheres and the left pons; the appearance is most compatible with an embolic event.  The cerebellar infarcts are slightly better characterized on the current study.  The left pontine infarct is only minimally visualized.  There is no evidence of hemorrhagic transformation.  No mass effect is seen.  Minimal periventricular white matter change likely reflects small vessel ischemic microangiopathy.  A prominent focus of calcification is noted at the left globus pallidus.  The third and lateral ventricles, and basal ganglia are unremarkable in appearance.  The cerebral hemispheres are symmetric in appearance, with normal gray-white differentiation.  No mass effect or midline shift is seen.  There is no evidence of fracture; visualized osseous structures are unremarkable in appearance.  The visualized portions of the orbits are within normal limits.  The paranasal sinuses and mastoid  air cells are well-aerated.  No significant soft tissue abnormalities are seen.  IMPRESSION: Scattered small acute infarcts noted within both cerebellar hemispheres and the left pons on recent MRI.  The appearance is most compatible with an embolic event.  The cerebellar infarcts are slightly better characterized on the current study; the left pontine infarct is only minimally visualized on CT.  No evidence of hemorrhagic transformation.   Original Report Authenticated By: Tonia Ghent, M.D.   Ct Head Wo Contrast  06/13/2012   *RADIOLOGY REPORT*  Clinical Data: Dizziness, slurred speech, left facial droop  CT HEAD WITHOUT CONTRAST  Technique:  Contiguous axial images were obtained from the base of the skull through the vertex without contrast.  Comparison: None.  Findings: No evidence of parenchymal hemorrhage or extra-axial fluid collection. No mass  lesion, mass effect, or midline shift.  No CT evidence of acute infarction.  Mild subcortical white matter and periventricular small vessel ischemic changes.  Intracranial atherosclerosis.  Cerebral volume is age appropriate.  No ventriculomegaly.  The visualized paranasal sinuses are essentially clear. The mastoid air cells are unopacified.  No evidence of calvarial fracture.  IMPRESSION: No evidence of acute intracranial abnormality.   Original Report Authenticated By: Charline Bills, M.D.   Mr [facility] Wo Contrast  06/13/2012   **ADDENDUM** CREATED: 06/13/2012 22:48:43  Note that the impression for the MRI of the brain should read: Small acute infarcts in the cerebellum bilaterally; small acute infarct in the left pons.  **END ADDENDUM** SIGNED BY: Tonia Ghent, M.D.  06/13/2012   *RADIOLOGY REPORT*  Clinical Data:  Dizziness.  Slurred speech.  MRI HEAD WITHOUT CONTRAST MRA HEAD WITHOUT CONTRAST  Technique:  Multiplanar, multiecho pulse sequences of the brain and surrounding structures were obtained without intravenous contrast. Angiographic images of the  head were obtained using MRA technique without contrast.  Comparison:  CT head 06/13/2012  MRI HEAD  Findings:  Multiple small areas of acute infarct in the cerebellum bilaterally.  Small acute infarct in the left pons.  No acute infarct above the tentorium.  Ventricle size is normal.  Minimal chronic microvascular ischemic change in the cerebral white matter.  Negative for hemorrhage or mass lesion.  Increased CSF over the convexity felt to be due to parietal atrophy. Asymmetric calcification left basal ganglia.  IMPRESSION: Chronic infarcts in the cerebellum bilaterally.  Acute infarct left pons.  MRA HEAD  Findings: Distal vertebral artery is diffusely diseased and ends in pica bilaterally.  Vertebral arteries do not appear to contribute to the basilar.  There is minimal flow in the mid basilar which appears diffusely diseased.  This is most likely fed by collateral circulation.  There is fetal origin of the posterior cerebral artery bilaterally which are widely patent.  Cerebellar arteries are not well seen likely due to slow flow and/or occlusion.  Internal carotid artery is patent bilaterally without significant stenosis.  Anterior  and middle cerebral arteries are patent bilaterally.  There is mild atherosclerotic disease involving middle cerebral artery branches bilaterally.  No aneurysm is identified.  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  There is very little flow in the basilar which is diffusely diseased, accounting for acute bilateral cerebellar and left pontine infarcts.  Original Report Authenticated By: Janeece Riggers, M.D.   Mr Brain Wo Contrast  06/13/2012   **ADDENDUM** CREATED: 06/13/2012 22:48:43  Note that the impression for the MRI of the brain should read: Small acute infarcts in the cerebellum bilaterally; small acute infarct in the left pons.  **END ADDENDUM** SIGNED BY: Tonia Ghent, M.D.  06/13/2012   *RADIOLOGY REPORT*  Clinical Data:  Dizziness.  Slurred  speech.  MRI HEAD WITHOUT CONTRAST MRA HEAD WITHOUT CONTRAST  Technique:  Multiplanar, multiecho pulse sequences of the brain and surrounding structures were obtained without intravenous contrast. Angiographic images of the head were obtained using MRA technique without contrast.  Comparison:  CT head 06/13/2012  MRI HEAD  Findings:  Multiple small areas of acute infarct in the cerebellum bilaterally.  Small acute infarct in the left pons.  No acute infarct above the tentorium.  Ventricle size is normal.  Minimal chronic microvascular ischemic change in the cerebral white matter.  Negative for hemorrhage or mass lesion.  Increased CSF over the convexity felt to be due to parietal atrophy.  Asymmetric calcification left basal ganglia.  IMPRESSION: Chronic infarcts in the cerebellum bilaterally.  Acute infarct left pons.  MRA HEAD  Findings: Distal vertebral artery is diffusely diseased and ends in pica bilaterally.  Vertebral arteries do not appear to contribute to the basilar.  There is minimal flow in the mid basilar which appears diffusely diseased.  This is most likely fed by collateral circulation.  There is fetal origin of the posterior cerebral artery bilaterally which are widely patent.  Cerebellar arteries are not well seen likely due to slow flow and/or occlusion.  Internal carotid artery is patent bilaterally without significant stenosis.  Anterior  and middle cerebral arteries are patent bilaterally.  There is mild atherosclerotic disease involving middle cerebral artery branches bilaterally.  No aneurysm is identified.  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  There is very little flow in the basilar which is diffusely diseased, accounting for acute bilateral cerebellar and left pontine infarcts.  Original Report Authenticated By: Janeece Riggers, M.D.    Medications: I have reviewed the patient's current medications. Scheduled Meds: . aspirin EC  81 mg Oral Daily  .  atorvastatin  80 mg Oral q1800  . clopidogrel  75 mg Oral Q breakfast  . enoxaparin (LOVENOX) injection  40 mg Subcutaneous Q24H  . potassium chloride  10 mEq Intravenous Q1 Hr x 3  . sodium chloride  500 mL Intravenous Once  . sodium chloride  3 mL Intravenous Q12H   Continuous Infusions:  PRN Meds:.sodium chloride, sodium chloride   Assessment/Plan: 79 yo F with a h/o HTN and CHF with CVA involving cerebellum bilaterally and left pons with multiple TIAs since admission.   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 Multiple TIAs yesterday. -Neuro following. Appreciate recs. Continue plavix and ASA 81 mg daily x 3 months (started 5/28). Single therapy thereafter with plavix.  -Continue frequent neuro checks.  -HgA1C 5.5, carotid dopplers wnl.  -On 2 L [place]. Wean as tolerated with goal SpO2 >92%  -Speech/OT/PT following.  -Diet advanced to heart healthy, thin after repeated swallow eval this AM afterTIAs yesterday.  -Minimize HOB elevation, but can sit up.  Strict bed rest given one of the TIAs occurred during ambulation. -Cardiac monitoring.   HTN  -Permissive HTN with goal systolic <220.  -Hold home antihypertensives given multiple TIAs yesterday.  Initiate BP control after 24 hrs of not having TIA.   HLD  -Fasting lipid panel indicates HDL.  -Continue atorvastatin 80 mg.   Hypokalemia -Likely 2/2 dehydration.  K 3.2 today. -Replaced with 10 mEq IV K. -BMP in AM.  CHF  -No evidence of CHF on echo.  Prophylaxis: lovenox 40 mg q24h   Access: L hand PIV (NS 5/27)   Code: DNR   Dispo: step down  -Anticipated date of discharge: unknown   This is a Psychologist, occupational Note.  The care of the patient was discussed with Dr. Dorma Russell and the assessment and plan formulated with their assistance.   Please see their attached note for official documentation of the daily encounter.  Harland Dingwall [facility] 06/15/2012, 8:52 AM

## 2012-06-15 NOTE — Progress Notes (Addendum)
Subjective: Multiple TIAs yesterday, pt tx to SDU. On bed rest and made NPO. Pt and family very concerned about NPO status.   Objective: Vital signs in last 24 hours: Filed Vitals:   06/14/12 2000 06/15/12 0000 06/15/12 0400 06/15/12 0800  BP: 182/87 186/62 166/75   Pulse: 65 63 65 60  Temp: 98.4 F (36.9 C) 98.2 F (36.8 C) 98.8 F (37.1 C)   TempSrc: Oral Oral Oral   Resp: 23 19 22 21   SpO2: 95% 96% 95% 98%   Weight change:   Intake/Output Summary (Last 24 hours) at 06/15/12 0857 Last data filed at 06/15/12 0700  Gross per 24 hour  Intake    975 ml  Output    900 ml  Net     75 ml  Vitals reviewed. General: resting in bed, NAD HEENT: PERRL, EOMI, no scleral icterus Cardiac: RRR, no rubs, murmurs or gallops Pulm: Clear to auscultation bilaterally, no wheezes, rales, or rhonchi, on 2L [place] Abd: Soft, nontender, nondistended, BS present Ext: Warm and well perfused, no pedal edema Neuro: Alert and oriented X3, Speech improved. Resolution of right eyelid droop still with mild right lateral lip lag. Still with hearing deficits, rapid hand movements slow bilaterally.   Lab Results: Basic Metabolic Panel:  Recent Labs Lab 06/13/12 0940 06/13/12 0951 06/15/12 0405  NA 139 141 139  K 4.0 4.0 3.2*  CL 103 107 102  CO2 24  --  28  GLUCOSE 119* 119* 102*  BUN 13 13 15   CREATININE 0.80 0.90 0.83  CALCIUM 10.1  --  10.2   Liver Function Tests:  Recent Labs Lab 06/13/12 0940 06/15/12 0405  AST 16 15  ALT 11 9  ALKPHOS 137* 127*  BILITOT 0.2* 0.5  PROT 6.9 6.6  ALBUMIN 3.2* 3.1*    Recent Labs Lab 06/13/12 1045  AMMONIA 20   CBC:  Recent Labs Lab 06/13/12 0940 06/13/12 0951 06/15/12 0405  WBC 6.4  --  5.8  NEUTROABS 5.1  --   --   HGB 11.7* 11.6* 11.3*  HCT 34.5* 34.0* 34.6*  MCV 82.3  --  82.6  PLT 270  --  273   Cardiac Enzymes:  Recent Labs Lab 06/13/12 0940  TROPONINI <0.30   CBG:  Recent Labs Lab 06/13/12 0853  GLUCAP 123*    Hemoglobin A1C:  Recent Labs Lab 06/14/12 0442  HGBA1C 5.5    Fasting Lipid Panel:  Recent Labs Lab 06/14/12 0442  CHOL 251*  HDL 47  LDLCALC 166*  TRIG 189*  CHOLHDL 5.3    Coagulation:  Recent Labs Lab 06/13/12 0940  LABPROT 13.4  INR 1.03   Urinalysis:  Recent Labs Lab 06/13/12 0925  COLORURINE YELLOW  LABSPEC 1.009  PHURINE 7.5  GLUCOSEU NEGATIVE  HGBUR NEGATIVE  BILIRUBINUR NEGATIVE  KETONESUR NEGATIVE  PROTEINUR NEGATIVE  UROBILINOGEN 0.2  NITRITE NEGATIVE  LEUKOCYTESUR NEGATIVE   Studies/Results: Dg Chest 2 View  06/13/2012   *RADIOLOGY REPORT*  Clinical Data: Dizziness.  CHEST - 2 VIEW  Comparison: 05/05/2004  Findings: Mild hyperinflation of the lungs.  Linear scarring or atelectasis at the left base.  Right lung is clear.  No effusions or acute bony abnormality.  IMPRESSION: Left basilar scarring or atelectasis.  Mild hyperinflation.   Original Report Authenticated By: Charlett Nose, M.D.   Ct Head Wo Contrast  06/13/2012   *RADIOLOGY REPORT*  Clinical Data: Acute left pontine infarct.  Persistent somnolence. Assess for progression of infarct.  CT HEAD WITHOUT  CONTRAST  Technique:  Contiguous axial images were obtained from the base of the skull through the vertex without contrast.  Comparison: CT of the head and MRI of the brain performed earlier today at 10:23 a.m. and 03:55 p.m.  Findings:   Scattered small acute infarcts were noted on MRI within both cerebellar hemispheres and the left pons; the appearance is most compatible with an embolic event.  The cerebellar infarcts are slightly better characterized on the current study.  The left pontine infarct is only minimally visualized.  There is no evidence of hemorrhagic transformation.  No mass effect is seen.  Minimal periventricular white matter change likely reflects small vessel ischemic microangiopathy.  A prominent focus of calcification is noted at the left globus pallidus.  The third and lateral  ventricles, and basal ganglia are unremarkable in appearance.  The cerebral hemispheres are symmetric in appearance, with normal gray-white differentiation.  No mass effect or midline shift is seen.  There is no evidence of fracture; visualized osseous structures are unremarkable in appearance.  The visualized portions of the orbits are within normal limits.  The paranasal sinuses and mastoid air cells are well-aerated.  No significant soft tissue abnormalities are seen.  IMPRESSION: Scattered small acute infarcts noted within both cerebellar hemispheres and the left pons on recent MRI.  The appearance is most compatible with an embolic event.  The cerebellar infarcts are slightly better characterized on the current study; the left pontine infarct is only minimally visualized on CT.  No evidence of hemorrhagic transformation.   Original Report Authenticated By: Tonia Ghent, M.D.   Ct Head Wo Contrast  06/13/2012   *RADIOLOGY REPORT*  Clinical Data: Dizziness, slurred speech, left facial droop  CT HEAD WITHOUT CONTRAST  Technique:  Contiguous axial images were obtained from the base of the skull through the vertex without contrast.  Comparison: None.  Findings: No evidence of parenchymal hemorrhage or extra-axial fluid collection. No mass lesion, mass effect, or midline shift.  No CT evidence of acute infarction.  Mild subcortical white matter and periventricular small vessel ischemic changes.  Intracranial atherosclerosis.  Cerebral volume is age appropriate.  No ventriculomegaly.  The visualized paranasal sinuses are essentially clear. The mastoid air cells are unopacified.  No evidence of calvarial fracture.  IMPRESSION: No evidence of acute intracranial abnormality.   Original Report Authenticated By: Charline Bills, M.D.   Mr [facility] Wo Contrast  06/13/2012   **ADDENDUM** CREATED: 06/13/2012 22:48:43  Note that the impression for the MRI of the brain should read: Small acute infarcts in the cerebellum  bilaterally; small acute infarct in the left pons.  **END ADDENDUM** SIGNED BY: Tonia Ghent, M.D.  06/13/2012   *RADIOLOGY REPORT*  Clinical Data:  Dizziness.  Slurred speech.  MRI HEAD WITHOUT CONTRAST MRA HEAD WITHOUT CONTRAST  Technique:  Multiplanar, multiecho pulse sequences of the brain and surrounding structures were obtained without intravenous contrast. Angiographic images of the head were obtained using MRA technique without contrast.  Comparison:  CT head 06/13/2012  MRI HEAD  Findings:  Multiple small areas of acute infarct in the cerebellum bilaterally.  Small acute infarct in the left pons.  No acute infarct above the tentorium.  Ventricle size is normal.  Minimal chronic microvascular ischemic change in the cerebral white matter.  Negative for hemorrhage or mass lesion.  Increased CSF over the convexity felt to be due to parietal atrophy. Asymmetric calcification left basal ganglia.  IMPRESSION: Chronic infarcts in the cerebellum bilaterally.  Acute infarct  left pons.  MRA HEAD  Findings: Distal vertebral artery is diffusely diseased and ends in pica bilaterally.  Vertebral arteries do not appear to contribute to the basilar.  There is minimal flow in the mid basilar which appears diffusely diseased.  This is most likely fed by collateral circulation.  There is fetal origin of the posterior cerebral artery bilaterally which are widely patent.  Cerebellar arteries are not well seen likely due to slow flow and/or occlusion.  Internal carotid artery is patent bilaterally without significant stenosis.  Anterior  and middle cerebral arteries are patent bilaterally.  There is mild atherosclerotic disease involving middle cerebral artery branches bilaterally.  No aneurysm is identified.  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  There is very little flow in the basilar which is diffusely diseased, accounting for acute bilateral cerebellar and left pontine infarcts.  Original  Report Authenticated By: Janeece Riggers, M.D.   Mr Brain Wo Contrast  06/13/2012   **ADDENDUM** CREATED: 06/13/2012 22:48:43  Note that the impression for the MRI of the brain should read: Small acute infarcts in the cerebellum bilaterally; small acute infarct in the left pons.  **END ADDENDUM** SIGNED BY: Tonia Ghent, M.D.  06/13/2012   *RADIOLOGY REPORT*  Clinical Data:  Dizziness.  Slurred speech.  MRI HEAD WITHOUT CONTRAST MRA HEAD WITHOUT CONTRAST  Technique:  Multiplanar, multiecho pulse sequences of the brain and surrounding structures were obtained without intravenous contrast. Angiographic images of the head were obtained using MRA technique without contrast.  Comparison:  CT head 06/13/2012  MRI HEAD  Findings:  Multiple small areas of acute infarct in the cerebellum bilaterally.  Small acute infarct in the left pons.  No acute infarct above the tentorium.  Ventricle size is normal.  Minimal chronic microvascular ischemic change in the cerebral white matter.  Negative for hemorrhage or mass lesion.  Increased CSF over the convexity felt to be due to parietal atrophy. Asymmetric calcification left basal ganglia.  IMPRESSION: Chronic infarcts in the cerebellum bilaterally.  Acute infarct left pons.  MRA HEAD  Findings: Distal vertebral artery is diffusely diseased and ends in pica bilaterally.  Vertebral arteries do not appear to contribute to the basilar.  There is minimal flow in the mid basilar which appears diffusely diseased.  This is most likely fed by collateral circulation.  There is fetal origin of the posterior cerebral artery bilaterally which are widely patent.  Cerebellar arteries are not well seen likely due to slow flow and/or occlusion.  Internal carotid artery is patent bilaterally without significant stenosis.  Anterior  and middle cerebral arteries are patent bilaterally.  There is mild atherosclerotic disease involving middle cerebral artery branches bilaterally.  No aneurysm is  identified.  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  There is very little flow in the basilar which is diffusely diseased, accounting for acute bilateral cerebellar and left pontine infarcts.  Original Report Authenticated By: Janeece Riggers, M.D.   Medications: I have reviewed the patient's current medications. Scheduled Meds: . aspirin EC  81 mg Oral Daily  . atorvastatin  80 mg Oral q1800  . clopidogrel  75 mg Oral Q breakfast  . enoxaparin (LOVENOX) injection  40 mg Subcutaneous Q24H  . potassium chloride  10 mEq Intravenous Q1 Hr x 3  . sodium chloride  500 mL Intravenous Once  . sodium chloride  3 mL Intravenous Q12H   Continuous Infusions:  PRN Meds:.sodium chloride, sodium chloride  Assessment/Plan: 79yo F with PMH HTN,  GERD, breast cancer s/p lumpectomy, and possible CHF present to the ED with dizziness, dysarthria, and difficulty ambulating.   Posterior circulation stroke:  Symptom onset possibly began over the past week with significant acute changes likely occurring overnight including dysarthria, decreased hearing, and ataxia- although she reports feeling unsteady on her feet over the past week. Her symptoms seem more consistent with a posterior circulation stroke. Neurology was consulted and recommended an MRI. Initial thoughts pointed toward CVA 2/2 HTN, given her history of uncontrolled HTN, with BP recorded as high as 199/66 on admission. 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 Carotid doppler and ECHO unremarkable for source of emboli. Lipid panel with elevated total cholesterol and LDL, Atorvastatin started this admission. She did experience multiple TIAs yesterday. Neuro was called and recommended complete bed rest for the patient with her lying flat. She was made NPO, but  passed a swallow study this morning by SLP. Dr. Pearlean Brownie approved elevation of the head of her bed to eat.  - Neuro checks q2h  - Vital signs q4h  - ASA 81mg  po daily and Plavix 75mg  po daily for 76mo, then one therapy alone. - PT/OT eval on hold for now until she is more stable - F/u with Neurology, appreciate recommendations   HTN:  Chronic HTN, not taking medication but supposed to be on Diovan. Alllowed for permissive HTN overnight in the setting of her CVA. BP still elevated this morning. Started HCTZ after she passed her initial swallow study with speech. With the TIAs, were allowing for permissive HTN, so HCTZ d/c'd.  Hypokalemia: Potassium 3.2 today. Likely from NPO status. Replacing with IV, but unable to tolerate. Receive IV and then transitioned to po.  -AM BMP  DVT PPx:  Lovenox, SCDs   Dispo: Disposition is deferred at this time, awaiting improvement of current medical problems.  Anticipated discharge in approximately 1-2 day(s).   The patient does have a current PCP (SCHULTZ,DOUGLAS E, MD), therefore will be requiring OPC follow-up after discharge.   The patient does not have transportation limitations that hinder transportation to clinic appointments.  .Services Needed at time of discharge: Y = Yes, Blank = No PT:   OT:   RN:   Equipment:   Other:     LOS: 2 days   Genelle Gather 06/15/2012, 8:57 AM

## 2012-06-16 DIAGNOSIS — R42 Dizziness and giddiness: Secondary | ICD-10-CM

## 2012-06-16 DIAGNOSIS — I10 Essential (primary) hypertension: Secondary | ICD-10-CM

## 2012-06-16 LAB — BASIC METABOLIC PANEL
BUN: 15 mg/dL (ref 6–23)
CO2: 27 mEq/L (ref 19–32)
Chloride: 103 mEq/L (ref 96–112)
GFR calc non Af Amer: 68 mL/min — ABNORMAL LOW (ref >90)
Glucose, Bld: 97 mg/dL (ref 70–99)
Potassium: 3.7 mEq/L (ref 3.5–5.1)
Sodium: 138 mEq/L (ref 135–145)

## 2012-06-16 NOTE — Progress Notes (Signed)
Speech Language Pathology Dysphagia Treatment Patient Details Name: Erin Branch MRN: [id number] DOB: 1933-05-09 Today's Date: 06/16/2012 Time: 4098-1191 SLP Time Calculation (min): 25 min  Assessment / Plan / Recommendation Clinical Impression  Pt continues to demonstrate normal function with POs. No complaints or change in status. SLP will follow via chart and return if pt has new needs. Pt will continue to need full sueprvision with POs, continue current diet.     Diet Recommendation  Continue with Current Diet: Regular;Thin liquid    SLP Plan Continue with current plan of care   Pertinent Vitals/Pain NA   Swallowing Goals  SLP Swallowing Goals Patient will utilize recommended strategies during swallow to increase swallowing safety with: Independent assistance Swallow Study Goal #2 - Progress: Progressing toward goal  General Temperature Spikes Noted: No Respiratory Status: Room air Behavior/Cognition: Alert;Cooperative;Pleasant mood Oral Cavity - Dentition: Adequate natural dentition Patient Positioning: Upright in bed  Oral Cavity - Oral Hygiene Does patient have any of the following "at risk" factors?: Oxygen therapy - cannula, mask, simple oxygen devices Brush patient's teeth BID with toothbrush (using toothpaste with fluoride): Yes   Dysphagia Treatment Treatment focused on: Skilled observation of diet tolerance Treatment Methods/Modalities: Skilled observation Patient observed directly with PO's: Yes Type of PO's observed: Thin liquids (pills with puree with RN) Feeding: Able to feed self Liquids provided via: Cup;Straw   GO    Erin Ditty, MA CCC-SLP [phone]  Claudine Mouton 06/16/2012, 11:13 AM

## 2012-06-16 NOTE — H&P (Signed)
Internal Medicine Teaching Service Resident Admission Note Date: 06/16/2012  Patient name: Erin Branch Medical record number: [id number] Date of birth: 28-Dec-1933 Age: 79 y.o. Gender: female PCP: Paulina Fusi, MD  Medical Service:  I have reviewed the note by Harland Dingwall, MS-III and was present during the interview and physical exam.  Please see below for findings, assessment, and plan.   Signed: Genelle Gather 06/16/2012, 1:11 PM

## 2012-06-16 NOTE — Progress Notes (Signed)
Resident Co-sign Daily Note: I have seen the patient and reviewed the daily progress note by Harland Dingwall, MS 3 and discussed the care of the patient with her.  See my separate note for documentation of my findings, assessment, and plans.    LOS: 2 days   Genelle Gather 06/16/2012, 1:09 PM

## 2012-06-16 NOTE — Progress Notes (Addendum)
Subjective: No further TIAs.   Objective: Vital signs in last 24 hours: Filed Vitals:   06/15/12 1510 06/15/12 2000 06/16/12 0000 06/16/12 0400  BP: 155/54 159/68 161/65 165/66  Pulse: 64 63 55 51  Temp:  98.6 F (37 C) 97.9 F (36.6 C) 98.2 F (36.8 C)  TempSrc:  Oral Oral Oral  Resp: 17 20 24 22   Height:      SpO2: 92% 97% 97% 96%   Weight change:   Intake/Output Summary (Last 24 hours) at 06/16/12 0932 Last data filed at 06/16/12 0600  Gross per 24 hour  Intake   2445 ml  Output   1195 ml  Net   1250 ml  Vitals reviewed. General: resting in bed, NAD HEENT: PERRL, EOMI, no scleral icterus Cardiac: RRR, no rubs, murmurs or gallops Pulm: Clear to auscultation bilaterally, no wheezes, rales, or rhonchi, on 2L [place] Abd: Soft, nontender, nondistended, BS present Ext: Warm and well perfused, no pedal edema Neuro: Alert and oriented X3, Speech improved. Resolution of right eyelid droop still with mild right lateral lip lag. Still with hearing deficits, rapid hand movements slow bilaterally R>L, but improved.   Lab Results: Basic Metabolic Panel:  Recent Labs Lab 06/15/12 0405 06/16/12 0752  NA 139 138  K 3.2* 3.7  CL 102 103  CO2 28 27  GLUCOSE 102* 97  BUN 15 15  CREATININE 0.83 0.81  CALCIUM 10.2 9.9   Liver Function Tests:  Recent Labs Lab 06/13/12 0940 06/15/12 0405  AST 16 15  ALT 11 9  ALKPHOS 137* 127*  BILITOT 0.2* 0.5  PROT 6.9 6.6  ALBUMIN 3.2* 3.1*    Recent Labs Lab 06/13/12 1045  AMMONIA 20   CBC:  Recent Labs Lab 06/13/12 0940 06/13/12 0951 06/15/12 0405  WBC 6.4  --  5.8  NEUTROABS 5.1  --   --   HGB 11.7* 11.6* 11.3*  HCT 34.5* 34.0* 34.6*  MCV 82.3  --  82.6  PLT 270  --  273   Cardiac Enzymes:  Recent Labs Lab 06/13/12 0940  TROPONINI <0.30   CBG:  Recent Labs Lab 06/13/12 0853  GLUCAP 123*   Hemoglobin A1C:  Recent Labs Lab 06/14/12 0442  HGBA1C 5.5    Fasting Lipid Panel:  Recent Labs Lab  06/14/12 0442  CHOL 251*  HDL 47  LDLCALC 166*  TRIG 189*  CHOLHDL 5.3    Coagulation:  Recent Labs Lab 06/13/12 0940  LABPROT 13.4  INR 1.03   Urinalysis:  Recent Labs Lab 06/13/12 0925  COLORURINE YELLOW  LABSPEC 1.009  PHURINE 7.5  GLUCOSEU NEGATIVE  HGBUR NEGATIVE  BILIRUBINUR NEGATIVE  KETONESUR NEGATIVE  PROTEINUR NEGATIVE  UROBILINOGEN 0.2  NITRITE NEGATIVE  LEUKOCYTESUR NEGATIVE   Studies/Results: No results found. Medications: I have reviewed the patient's current medications. Scheduled Meds: . aspirin EC  81 mg Oral Daily  . atorvastatin  80 mg Oral q1800  . clopidogrel  75 mg Oral Q breakfast  . enoxaparin (LOVENOX) injection  40 mg Subcutaneous Q24H  . sodium chloride  500 mL Intravenous Once  . sodium chloride  3 mL Intravenous Q12H   Continuous Infusions:  PRN Meds:.sodium chloride, sodium chloride  Assessment/Plan: 79yo F with PMH HTN, GERD, breast cancer s/p lumpectomy, and possible CHF present to the ED with dizziness, dysarthria, and difficulty ambulating.   Posterior circulation stroke:  Symptom onset possibly began over the past week with significant acute changes likely occurring overnight including dysarthria, decreased hearing, and  ataxia- although she reports feeling unsteady on her feet over the past week. Her symptoms seem more consistent with a posterior circulation stroke. Neurology was consulted and recommended an MRI. Initial thoughts pointed toward CVA 2/2 HTN, given her history of uncontrolled HTN, with BP recorded as high as 199/66 on admission. 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 Carotid doppler and ECHO unremarkable for source of emboli. Lipid panel with elevated total cholesterol and LDL, Atorvastatin started this admission. She did experience  multiple TIAs on 5/28 but none since. Neuro was called and recommended complete bed rest for the patient with her lying flat. She was made NPO, but passed a swallow study this morning by SLP. Dr. Pearlean Brownie approved her to get out of bed.   - Neuro checks q2h  - Vital signs q4h  - ASA 81mg  po daily and Plavix 75mg  po daily for 3mo, then one therapy alone. - PT/OT eval on hold for now until she is more stable - F/u with Neurology, appreciate recommendations   HTN:  Chronic HTN, not taking medication but supposed to be on Diovan. Alllowed for permissive HTN overnight in the setting of her CVA. BP still elevated this morning. Started HCTZ after she passed her initial swallow study with speech. With the TIAs, were allowing for permissive HTN, so HCTZ d/c'd.Will talk to Neurology regarding starting antihypertensives.  Hypokalemia: Resolved Potassium 3.2 on 5/29, 3.7 today. Likely from NPO status. Replacing with IV, but unable to tolerate. Receive IV and then transitioned to po.  -AM BMP  DVT PPx:  Lovenox, SCDs   Dispo: Disposition is deferred at this time, awaiting improvement of current medical problems.  Anticipated discharge in approximately 1-2 day(s).   The patient does have a current PCP (SCHULTZ,DOUGLAS E, MD), therefore will be requiring OPC follow-up after discharge.   The patient does not have transportation limitations that hinder transportation to clinic appointments.  .Services Needed at time of discharge: Y = Yes, Blank = No PT:   OT:   RN:   Equipment:   Other:     LOS: 3 days   Genelle Gather 06/16/2012, 9:32 AM

## 2012-06-16 NOTE — Evaluation (Signed)
Occupational Therapy Re-Evaluation Patient Details Name: Erin Branch MRN: [id number] DOB: Oct 12, 1933 Today's Date: 06/16/2012 Time: 7829-5621 OT Time Calculation (min): 26 min  OT Assessment / Plan / Recommendation Clinical Impression  MD had discontinued OT services following events of 5/28. Now re-ordered OT evaluation. Performed re-evaluation today with data similar from 5/28 evaluation. Goals remain appropriate. Will continue to follow acutely in order to address below problem list in prep for return home with family assist.    OT Assessment  Patient needs continued OT Services    Follow Up Recommendations  Supervision/Assistance - 24 hour;Home health OT    Barriers to Discharge      Equipment Recommendations  None recommended by OT    Recommendations for Other Services    Frequency  Min 3X/week    Precautions / Restrictions Precautions Precautions: Fall Restrictions Weight Bearing Restrictions: No   Pertinent Vitals/Pain BP 162/62 lying     ADL  Grooming: Performed;Wash/dry face;Teeth care;Supervision/safety Where Assessed - Grooming: Unsupported standing Upper Body Bathing: Simulated;Set up Where Assessed - Upper Body Bathing: Unsupported standing Lower Body Bathing: Simulated;Minimal assistance Where Assessed - Lower Body Bathing: Supported sit to stand Upper Body Dressing: Simulated;Set up Where Assessed - Upper Body Dressing: Unsupported sitting Lower Body Dressing: Simulated;Minimal assistance Where Assessed - Lower Body Dressing: Supported sit to stand Toilet Transfer: Simulated;Minimal assistance Toilet Transfer Method:  (ambulating) Acupuncturist:  (bed to ambulate in room/unit, then to recliner in room) Equipment Used: Gait belt;Rolling walker Transfers/Ambulation Related to ADLs: min assist with RW.  Occasional assist to maneuver RW. ADL Comments: Pt very eager to participate in therapy session. Generally weak from laying in bed for past day  but otherwise doing well.    OT Diagnosis: Generalized weakness  OT Problem List: Decreased strength;Decreased activity tolerance;Impaired balance (sitting and/or standing);Decreased knowledge of use of DME or AE OT Treatment Interventions: Self-care/ADL training;DME and/or AE instruction;Therapeutic activities;Patient/family education;Balance training   OT Goals ADL Goals Pt Will Perform Grooming: Unsupported;Standing at sink;with modified independence ADL Goal: Grooming - Progress: Goal set today Pt Will Perform Upper Body Dressing: Independently;Sitting, chair;Sitting, bed ADL Goal: Upper Body Dressing - Progress: Goal set today Pt Will Perform Lower Body Dressing: with modified independence;Sit to stand from chair;Sit to stand from bed ADL Goal: Lower Body Dressing - Progress: Goal set today Pt Will Transfer to Toilet: with supervision;Ambulation;with DME;Regular height toilet ADL Goal: Toilet Transfer - Progress: Goal set today Pt Will Perform Toileting - Clothing Manipulation: with modified independence;Standing ADL Goal: Toileting - Clothing Manipulation - Progress: Goal set today Pt Will Perform Toileting - Hygiene: with modified independence;Sit to stand from 3-in-1/toilet ADL Goal: Toileting - Hygiene - Progress: Goal set today  Visit Information  Last OT Received On: 06/16/12 Assistance Needed: +1 PT/OT Co-Evaluation/Treatment: Yes    Subjective Data      Prior Functioning     Home Living Lives With: Spouse Available Help at Discharge: Family;Available 24 hours/day Type of Home: House Home Access: Stairs to enter [employer] of Steps: 2 Home Layout: Two level;Full bath on main level Bathroom Shower/Tub: Tub/shower unit;Walk-in shower Bathroom Toilet: Standard Home Adaptive Equipment: Tub transfer bench;Bedside commode/3-in-1 Prior Function Level of Independence: Independent Driving: Yes Vocation: Retired Musician: No  difficulties Dominant Hand: Right         Vision/Perception Vision - History Baseline Vision:  (wears contacts but does not have them here) Patient Visual Report: No change from baseline Vision - Assessment Eye Alignment: Within Functional Limits  Cognition  Cognition Arousal/Alertness: Awake/alert Behavior During Therapy: WFL for tasks assessed/performed Overall Cognitive Status: Within Functional Limits for tasks assessed    Extremity/Trunk Assessment Right Upper Extremity Assessment RUE ROM/Strength/Tone: WFL for tasks assessed RUE Sensation: WFL - Light Touch;WFL - Proprioception RUE Coordination: WFL - gross/fine motor Left Upper Extremity Assessment LUE ROM/Strength/Tone: WFL for tasks assessed LUE Sensation: WFL - Light Touch;WFL - Proprioception LUE Coordination: WFL - gross/fine motor Right Lower Extremity Assessment RLE ROM/Strength/Tone: WFL for tasks assessed RLE Sensation: WFL - Light Touch Left Lower Extremity Assessment LLE ROM/Strength/Tone: WFL for tasks assessed LLE Sensation: WFL - Light Touch Trunk Assessment Trunk Assessment: Normal     Mobility Bed Mobility Bed Mobility: Supine to Sit;Sitting - Scoot to Edge of Bed Supine to Sit: 4: Min guard;With rails Sitting - Scoot to Edge of Bed: 4: Min guard Details for Bed Mobility Assistance: Assist for safety only Transfers Transfers: Sit to Stand;Stand to Sit Sit to Stand: 4: Min guard;From bed;With upper extremity assist Stand to Sit: 4: Min guard;To chair/3-in-1;With armrests;With upper extremity assist Details for Transfer Assistance: Pt independently recalling and verbalizing safe hand placement and sequencing from last session.     Exercise     Balance Balance Balance Assessed: Yes Static Standing Balance Static Standing - Balance Support: No upper extremity supported;During functional activity Static Standing - Level of Assistance: 5: Stand by assistance Static Standing - Comment/# of  Minutes: During grooming ADLs at sink.   End of Session OT - End of Session Equipment Utilized During Treatment: Gait belt Activity Tolerance: Patient tolerated treatment well Patient left: in chair;with call bell/phone within reach;with family/visitor present Nurse Communication: Mobility status  GO    06/16/2012 Cipriano Mile OTR/L Pager [phone] Office [phone]  Cipriano Mile 06/16/2012, 4:49 PM

## 2012-06-16 NOTE — Progress Notes (Signed)
Stroke Team Progress Note  HISTORY Erin Branch is an 79 y.o. female, right handed, with a past medical history significant for hypertension, remote breast cancer, brought to [facility] ED due to new onset unsteadiness, vertigo, impaired hearing, and left face droopiness.  Never had similar symptoms before, but 3 days ago developed sudden onset vertigo and decreased hearing and when she woke up this morning she was severely off balance to the degree that needed help from her husband in order to walk and avoid falling. Additionally, she was noted to have slurred speech and some droopiness of the left face.  Denies headache, double vision, difficulty swallowing, focal weakness or numbness, confusion.  Family reports a recent upper respiratory infection.  Upon arrival to ED she had a CT brain that did not show acute intracranial abnormality.   Date last known well: 3 days ago  Time last known well: uncertain.   Patient was not a TPA candidate secondary to delay in arrival. She was admitted for further evaluation and treatment.  SUBJECTIVE Daughter at bedside. No further worsening of symptoms. Feels better.  OBJECTIVE Most recent Vital Signs: Filed Vitals:   06/15/12 1510 06/15/12 2000 06/16/12 0000 06/16/12 0400  BP: 155/54 159/68 161/65 165/66  Pulse: 64 63 55 51  Temp:  98.6 F (37 C) 97.9 F (36.6 C) 98.2 F (36.8 C)  TempSrc:  Oral Oral Oral  Resp: 17 20 24 22   Height:      SpO2: 92% 97% 97% 96%   CBG (last 3)   Recent Labs  06/13/12 0853  GLUCAP 123*   IV Fluid Intake:     MEDICATIONS  . aspirin EC  81 mg Oral Daily  . atorvastatin  80 mg Oral q1800  . clopidogrel  75 mg Oral Q breakfast  . enoxaparin (LOVENOX) injection  40 mg Subcutaneous Q24H  . sodium chloride  500 mL Intravenous Once  . sodium chloride  3 mL Intravenous Q12H   PRN:  sodium chloride, sodium chloride  Diet:  Cardiac thin Activity: OOB with assistance DVT Prophylaxis:  Lovenox 40 mg sq daily    CLINICALLY SIGNIFICANT STUDIES Basic Metabolic Panel:   Recent Labs Lab 06/13/12 0940 06/13/12 0951 06/15/12 0405  NA 139 141 139  K 4.0 4.0 3.2*  CL 103 107 102  CO2 24  --  28  GLUCOSE 119* 119* 102*  BUN 13 13 15   CREATININE 0.80 0.90 0.83  CALCIUM 10.1  --  10.2   Liver Function Tests:   Recent Labs Lab 06/13/12 0940 06/15/12 0405  AST 16 15  ALT 11 9  ALKPHOS 137* 127*  BILITOT 0.2* 0.5  PROT 6.9 6.6  ALBUMIN 3.2* 3.1*   CBC:   Recent Labs Lab 06/13/12 0940 06/13/12 0951 06/15/12 0405  WBC 6.4  --  5.8  NEUTROABS 5.1  --   --   HGB 11.7* 11.6* 11.3*  HCT 34.5* 34.0* 34.6*  MCV 82.3  --  82.6  PLT 270  --  273   Coagulation:   Recent Labs Lab 06/13/12 0940  LABPROT 13.4  INR 1.03   Cardiac Enzymes:   Recent Labs Lab 06/13/12 0940  TROPONINI <0.30   Urinalysis:   Recent Labs Lab 06/13/12 0925  COLORURINE YELLOW  LABSPEC 1.009  PHURINE 7.5  GLUCOSEU NEGATIVE  HGBUR NEGATIVE  BILIRUBINUR NEGATIVE  KETONESUR NEGATIVE  PROTEINUR NEGATIVE  UROBILINOGEN 0.2  NITRITE NEGATIVE  LEUKOCYTESUR NEGATIVE   Lipid Panel    Component Value Date/Time  CHOL 251* 06/14/2012 0442   TRIG 189* 06/14/2012 0442   HDL 47 06/14/2012 0442   CHOLHDL 5.3 06/14/2012 0442   VLDL 38 06/14/2012 0442   LDLCALC 166* 06/14/2012 0442   HgbA1C  Lab Results  Component Value Date   HGBA1C 5.5 06/14/2012   Urine Drug Screen:   No results found for this basename: labopia,  cocainscrnur,  labbenz,  amphetmu,  thcu,  labbarb    Alcohol Level: No results found for this basename: ETH,  in the last 168 hours  CT of the brain   06/13/2012    Scattered small acute infarcts noted within both cerebellar hemispheres and the left pons on recent MRI.  The appearance is most compatible with an embolic event.  The cerebellar infarcts are slightly better characterized on the current study; the left pontine infarct is only minimally visualized on CT.  No evidence of hemorrhagic  transformation.   06/13/2012    No evidence of acute intracranial abnormality.   MRI of the brain  06/13/2012    Note that the impression for the MRI of the brain should read: Small acute infarcts in the cerebellum bilaterally; small acute infarct in the left pons.    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  There is very little flow in the basilar which is diffusely diseased, accounting for acute bilateral cerebellar and left pontine infarcts  2D Echocardiogram  EF 60-65% with no source of embolus. No significant valvular abnormalities  Carotid Doppler  No evidence of hemodynamically significant internal carotid artery stenosis. Vertebral artery flow is antegrade.   CXR  06/13/2012   Left basilar scarring or atelectasis.  Mild hyperinflation.   EKG  normal sinus rhythm.   Therapy Recommendations home health PT and OT  Physical Exam   Frail elderly petite Caucasian lady sitting up in bed.Awake alert. Afebrile. Head is nontraumatic. Neck is supple without bruit. Hearing is normal. Cardiac exam no murmur or gallop. Lungs are clear to auscultation. Distal pulses are well felt.  Neurological Exam : Awake alert oriented x 3 dysarthric speech extraocular movements are full range without nystagmus but mild saccadic dysmetric on left gaze  . Mild left lower face asymmetry. Tongue midline. No drift. Mild diminished fine finger movements on left. Orbits right over left upper extremity. Mild left grip weak.. Normal sensation . Mildly impaired left finger-to-nose and knee to heel coordination. Gait deferred  ASSESSMENT [name] is a 79 y.o. female presenting with ataxia, vertigo, decreased hearing, and left face weakness.  Imaging confirms bilateral cerebellar and a small left pontine infarct. 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 Waxing and waning of symptoms 06/14/2012 - 1230, 330, 340, 530, each  lasting around 2-3 mins- described as eyes glazed over and unresponsive by dtrs - transferred to the ICU. Patient is at risk for neuro worsening given baseline severe posterior circulation disease.  On no antithrombotics prior to admission. Now on aspirin 81 mg orally every day and clopidogrel 75 mg orally every day for secondary stroke prevention, though pt currently NPO. Patient with resultant ataxia, dysathrtia, dysphagia, vertigo, left face hemiparesis.   Hypertension Hyperlipidemia, LDL 166, on no statin PTA, now on lipitor 80 mg daily, goal LDL < 100 (< 70 for diabetics)' Dysphagia, coughing with food intake 5/28. Cleared by ST in the afternoon for reg diet, thin liquid Hx breast cancer Hypokalemia Patient passed swallow  Hospital day #  3  TREATMENT/PLAN  Given severely stenosis/hypoplastic posterior circulation, recommend continuation of aspirin 81 mg orally every day and clopidogrel 75 mg orally every day for secondary stroke prevention x 3 mos then one alone  Continue hydration  Recheck potassium/magnesium  Initiate out of bed orders. Probable dc home in 1-2 days if stable  Dr. Pearlean Brownie discussed diagnosis, prognosis,  treatment options and plan of care with Daughter.  Job Founds, MBA, MHA [facility] Pager [phone] 06/16/2012 7:31 AM  I have personally obtained a history, examined the patient, evaluated imagi2ng results, and formulated the assessment and plan of care. I agree with the above.  Delia Heady, MD

## 2012-06-16 NOTE — Progress Notes (Signed)
Resident Co-sign Daily Note: I have seen the patient and reviewed the daily progress note by Harland Dingwall, MS 3 and discussed the care of the patient with her.  See my separate note for documentation of my findings, assessment, and plans.     LOS: 1 days   Genelle Gather 06/16/2012, 1:10 PM

## 2012-06-16 NOTE — Evaluation (Signed)
Physical Therapy Re-Evaluation Patient Details Name: Erin Branch MRN: [id number] DOB: [birth year] Today's Date: 06/16/2012 Time: 1191-4782 PT Time Calculation (min): 25 min  PT Assessment / Plan / Recommendation Clinical Impression  MD had discontinued PT services following events of 5/28.  Now re-ordered PT evaluation.  Performed re-evaluation today with data similar from 5/28 evaluation.  Goals remain appropriate.  Continue to recommend ongoing PT to maximize independence prior to discharge, and HHPT at discharge to continue therapy.    PT Assessment  Patient needs continued PT services    Follow Up Recommendations  Home health PT;Supervision/Assistance - 24 hour    Does the patient have the potential to tolerate intense rehabilitation      Barriers to Discharge None      Equipment Recommendations  Rolling walker with 5" wheels    Recommendations for Other Services     Frequency Min 3X/week    Precautions / Restrictions Precautions Precautions: Fall Restrictions Weight Bearing Restrictions: No   Pertinent Vitals/Pain       Mobility  Bed Mobility Bed Mobility: Supine to Sit;Sitting - Scoot to Edge of Bed Supine to Sit: 4: Min guard;With rails Sitting - Scoot to Edge of Bed: 4: Min guard Details for Bed Mobility Assistance: Assist for safety only Transfers Transfers: Sit to Stand;Stand to Sit Sit to Stand: 4: Min guard;With upper extremity assist;From bed Stand to Sit: 4: Min guard;With upper extremity assist;With armrests;To chair/3-in-1 Details for Transfer Assistance: Verbal cues for hand placement.  Patient able to recall hand placement when sitting from initial session. Ambulation/Gait Ambulation/Gait Assistance: 4: Min assist Ambulation Distance (Feet): 172 Feet Assistive device: Rolling walker Ambulation/Gait Assistance Details: Verbal cues for safe use of RW.  Patient with good balance with RW.  Cues to stay on task - distracted easily, stopping mid gait.   Cues to continue with ambulation. Gait Pattern: Step-through pattern;Decreased stride length;Narrow base of support Gait velocity: decreased requires pacing Modified Rankin (Stroke Patients Only) Pre-Morbid Rankin Score: No symptoms Modified Rankin: Moderately severe disability    Exercises     PT Diagnosis: Difficulty walking;Generalized weakness  PT Problem List: Decreased activity tolerance;Decreased balance;Decreased mobility;Decreased knowledge of use of DME PT Treatment Interventions: DME instruction;Gait training;Stair training;Functional mobility training;Patient/family education   PT Goals Acute Rehab PT Goals PT Goal Formulation: With patient Time For Goal Achievement: 06/23/12 Potential to Achieve Goals: Good Pt will go Sit to Stand: with supervision;with upper extremity assist PT Goal: Sit to Stand - Progress: Goal set today Pt will Ambulate: >150 feet;with supervision;with rolling walker PT Goal: Ambulate - Progress: Goal set today Pt will Go Up / Down Stairs: 3-5 stairs;with min assist;with least restrictive assistive device PT Goal: Up/Down Stairs - Progress: Goal set today  Visit Information  Last PT Received On: 06/16/12 Assistance Needed: +1 PT/OT Co-Evaluation/Treatment: Yes    Subjective Data  Subjective: "Can I have the decatheter (catheter) out?  It's uncomfortable" Patient Stated Goal: To go home   Prior Functioning  Communication Communication: No difficulties Dominant Hand: Right    Cognition  Cognition Arousal/Alertness: Awake/alert Behavior During Therapy: WFL for tasks assessed/performed Overall Cognitive Status: Within Functional Limits for tasks assessed    Extremity/Trunk Assessment Right Upper Extremity Assessment RUE ROM/Strength/Tone: [facility] for tasks assessed Left Upper Extremity Assessment LUE ROM/Strength/Tone: WFL for tasks assessed Right Lower Extremity Assessment RLE ROM/Strength/Tone: WFL for tasks assessed RLE Sensation: WFL -  Light Touch Left Lower Extremity Assessment LLE ROM/Strength/Tone: WFL for tasks assessed LLE Sensation: WFL - Light  Touch Trunk Assessment Trunk Assessment: Normal   Balance Balance Balance Assessed: Yes Static Standing Balance Static Standing - Balance Support: No upper extremity supported Static Standing - Level of Assistance: 5: Stand by assistance Static Standing - Comment/# of Minutes: 6 minutes.  Patient able to stand at sink and wash hands and brush teeth with good balance.  End of Session PT - End of Session Equipment Utilized During Treatment: Gait belt Activity Tolerance: Patient limited by fatigue Patient left: in chair;with call bell/phone within reach;with family/visitor present Nurse Communication: Mobility status  GP     Vena Austria 06/16/2012, 4:13 PM Durenda Hurt. Renaldo Fiddler, [facility] Acute Rehab Services Pager [phone]

## 2012-06-16 NOTE — Progress Notes (Signed)
Internal Medicine Teaching Service Attending Note Date: 06/16/2012  Patient name: Erin Branch  Medical record number: [id number]  Date of birth: May 29, 1933    This patient has been seen and discussed with the house staff. Please see their note for complete details. I concur with their findings with the following additions/corrections:  Erin Branch is likely good to attempt PT today.  She will have OOB orders.  I have advised her if her symptoms should return, that she should immediately lie down.  Neurology is following.  Continue ASA, plavix.  She is at high risk for another stroke/event with her pathology on MRI and history.  Continue stepdown today with close neurological monitoring.  Transfer to floor bed tomorrow if does well with PT today.   MULLEN, EMILY 06/16/2012, 1:05 PM

## 2012-06-17 LAB — BASIC METABOLIC PANEL
BUN: 16 mg/dL (ref 6–23)
CO2: 25 mEq/L (ref 19–32)
Chloride: 107 mEq/L (ref 96–112)
Creatinine, Ser: 0.77 mg/dL (ref 0.50–1.10)
GFR calc Af Amer: >90 mL/min (ref >90)
Potassium: 3.6 mEq/L (ref 3.5–5.1)

## 2012-06-17 MED ORDER — HYDRALAZINE HCL 20 MG/ML IJ SOLN
5.0000 mg | Freq: Once | INTRAMUSCULAR | Status: AC | PRN
  Administered 2012-06-17: 5 mg via INTRAVENOUS
  Filled 2012-06-17: qty 1

## 2012-06-17 MED ORDER — HYDRALAZINE HCL 20 MG/ML IJ SOLN: 5.0000 mg | INTRAMUSCULAR | Status: DC | PRN

## 2012-06-17 MED ORDER — HYDRALAZINE HCL 20 MG/ML IJ SOLN: 5.0000 mg | Freq: Once | INTRAMUSCULAR | Status: DC

## 2012-06-17 MED ORDER — HYDROCHLOROTHIAZIDE 25 MG PO TABS
25.0000 mg | ORAL_TABLET | Freq: Every day | ORAL | Status: DC
  Administered 2012-06-17 – 2012-06-18 (×2): 25 mg via ORAL
  Filled 2012-06-17 (×2): qty 1

## 2012-06-17 MED ORDER — POTASSIUM CHLORIDE CRYS ER 20 MEQ PO TBCR
40.0000 meq | EXTENDED_RELEASE_TABLET | Freq: Once | ORAL | Status: AC
  Administered 2012-06-17: 40 meq via ORAL
  Filled 2012-06-17: qty 2

## 2012-06-17 NOTE — Progress Notes (Addendum)
Stroke Team Progress Note  HISTORY Erin Branch is an 79 y.o. female, right handed, with a past medical history significant for hypertension, remote breast cancer, brought to [facility] ED due to new onset unsteadiness, vertigo, impaired hearing, and left face droopiness.  Never had similar symptoms before, but 3 days ago developed sudden onset vertigo and decreased hearing and when she woke up this morning she was severely off balance to the degree that needed help from her husband in order to walk and avoid falling. Additionally, she was noted to have slurred speech and some droopiness of the left face.  Denies headache, double vision, difficulty swallowing, focal weakness or numbness, confusion.  Family reports a recent upper respiratory infection.  Upon arrival to ED she had a CT brain that did not show acute intracranial abnormality.   Date last known well: 3 days ago  Time last known well: uncertain.   Patient was not a TPA candidate secondary to delay in arrival. She was admitted for further evaluation and treatment.  SUBJECTIVE Daughter at bedside. No further worsening of symptoms. Feels better. Pt states she is close to baseline at this point.   OBJECTIVE Most recent Vital Signs: Filed Vitals:   06/17/12 0000 06/17/12 0328 06/17/12 0400 06/17/12 0733  BP: 185/67  160/65   Pulse: 70  52   Temp:  97.8 F (36.6 C)  97.9 F (36.6 C)  TempSrc:  Oral  Oral  Resp: 20  26   Height:      SpO2: 94%  93%    CBG (last 3)  No results found for this basename: GLUCAP,  in the last 72 hours IV Fluid Intake:     MEDICATIONS  . aspirin EC  81 mg Oral Daily  . atorvastatin  80 mg Oral q1800  . clopidogrel  75 mg Oral Q breakfast  . enoxaparin (LOVENOX) injection  40 mg Subcutaneous Q24H  . sodium chloride  500 mL Intravenous Once  . sodium chloride  3 mL Intravenous Q12H   PRN:  sodium chloride, sodium chloride  Diet:  Cardiac thin Activity: OOB with assistance DVT Prophylaxis:  Lovenox  40 mg sq daily   CLINICALLY SIGNIFICANT STUDIES Basic Metabolic Panel:   Recent Labs Lab 06/16/12 0752 06/17/12 0522  NA 138 142  K 3.7 3.6  CL 103 107  CO2 27 25  GLUCOSE 97 98  BUN 15 16  CREATININE 0.81 0.77  CALCIUM 9.9 9.8  MG  --  1.9   Liver Function Tests:   Recent Labs Lab 06/13/12 0940 06/15/12 0405  AST 16 15  ALT 11 9  ALKPHOS 137* 127*  BILITOT 0.2* 0.5  PROT 6.9 6.6  ALBUMIN 3.2* 3.1*   CBC:   Recent Labs Lab 06/13/12 0940 06/13/12 0951 06/15/12 0405  WBC 6.4  --  5.8  NEUTROABS 5.1  --   --   HGB 11.7* 11.6* 11.3*  HCT 34.5* 34.0* 34.6*  MCV 82.3  --  82.6  PLT 270  --  273   Coagulation:   Recent Labs Lab 06/13/12 0940  LABPROT 13.4  INR 1.03   Cardiac Enzymes:   Recent Labs Lab 06/13/12 0940  TROPONINI <0.30   Urinalysis:   Recent Labs Lab 06/13/12 0925  COLORURINE YELLOW  LABSPEC 1.009  PHURINE 7.5  GLUCOSEU NEGATIVE  HGBUR NEGATIVE  BILIRUBINUR NEGATIVE  KETONESUR NEGATIVE  PROTEINUR NEGATIVE  UROBILINOGEN 0.2  NITRITE NEGATIVE  LEUKOCYTESUR NEGATIVE   Lipid Panel    Component  Value Date/Time   CHOL 251* 06/14/2012 0442   TRIG 189* 06/14/2012 0442   HDL 47 06/14/2012 0442   CHOLHDL 5.3 06/14/2012 0442   VLDL 38 06/14/2012 0442   LDLCALC 166* 06/14/2012 0442   HgbA1C  Lab Results  Component Value Date   HGBA1C 5.5 06/14/2012   Urine Drug Screen:   No results found for this basename: labopia,  cocainscrnur,  labbenz,  amphetmu,  thcu,  labbarb    Alcohol Level: No results found for this basename: ETH,  in the last 168 hours  CT of the brain   06/13/2012    Scattered small acute infarcts noted within both cerebellar hemispheres and the left pons on recent MRI.  The appearance is most compatible with an embolic event.  The cerebellar infarcts are slightly better characterized on the current study; the left pontine infarct is only minimally visualized on CT.  No evidence of hemorrhagic transformation.    06/13/2012    No evidence of acute intracranial abnormality.   MRI of the brain  06/13/2012    Note that the impression for the MRI of the brain should read: Small acute infarcts in the cerebellum bilaterally; small acute infarct in the left pons.    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  There is very little flow in the basilar which is diffusely diseased, accounting for acute bilateral cerebellar and left pontine infarcts  2D Echocardiogram  EF 60-65% with no source of embolus. No significant valvular abnormalities  Carotid Doppler  No evidence of hemodynamically significant internal carotid artery stenosis. Vertebral artery flow is antegrade.   CXR  06/13/2012   Left basilar scarring or atelectasis.  Mild hyperinflation.   EKG  normal sinus rhythm.   Therapy Recommendations home health PT and OT  Physical Exam   Frail elderly petite Caucasian lady sitting up in bed.Awake alert. Afebrile. Head is nontraumatic. Neck is supple without bruit. Hearing is normal. Cardiac exam no murmur or gallop. Lungs are clear to auscultation. Distal pulses are well felt.  Neurological Exam : Awake alert oriented x 3 dysarthric speech extraocular movements are full range without nystagmus but mild saccadic dysmetric on left gaze  . Mild left lower face asymmetry. Tongue midline. No drift. Mild diminished fine finger movements on left. Orbits right over left upper extremity. Mild left grip weak.. Normal sensation . Mildly impaired left finger-to-nose and knee to heel coordination. Gait deferred  ASSESSMENT Erin Branch is a 79 y.o. female presenting with ataxia, vertigo, decreased hearing, and left face weakness.  Imaging confirms bilateral cerebellar and a small left pontine infarct. 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 Waxing and waning of symptoms 06/14/2012 - 1230, 330, 340, 530, each lasting around 2-3  mins- described as eyes glazed over and unresponsive by dtrs - transferred to the ICU. Patient is at risk for neuro worsening given baseline severe posterior circulation disease.  On no antithrombotics prior to admission. Now on aspirin 81 mg orally every day and clopidogrel 75 mg orally every day for secondary stroke prevention, though pt currently NPO. Patient with resultant ataxia, dysathrtia, dysphagia, vertigo, left face hemiparesis.   Hypertension Hyperlipidemia, LDL 166, on no statin PTA, now on lipitor 80 mg daily, goal LDL < 100 (< 70 for diabetics)' Dysphagia, coughing with food intake 5/28. Cleared by ST in the afternoon for reg diet, thin liquid Hx breast cancer Hypokalemia Patient passed  swallow  Hospital day # 4  TREATMENT/PLAN  Given severely stenosis/hypoplastic posterior circulation, recommend continuation of aspirin 81 mg orally every day and clopidogrel 75 mg orally every day for secondary stroke prevention x 3 mos then one alone  Continue hydration  Improvement in electrolytes  Con't PT/Ot, pt passed speech eval  Can be d/c from Neuro standpoint when ready which is probably in the next few days.   B/P control as in AM BP is 197/75, Would like it below 160SBP  Please restart pt's home Diovan.   discussed  treatment options and plan of care with Daughter.  Pauletta Browns  06/17/2012 8:41 AM

## 2012-06-17 NOTE — Progress Notes (Signed)
Foley d/c at 1024 per MD order pt tol well

## 2012-06-17 NOTE — Progress Notes (Signed)
Pt c/o cont current ringing in ear, which has happened prior to admission intermittently since November, nursing will cont to monitor

## 2012-06-17 NOTE — Progress Notes (Addendum)
Subjective: No further TIAs. She does endorse a pulsating sound in her right ear that has been present since November but has been intermittent in the hospital, and has now resolved. Pt states that she is feeling good and wants to go home.   Objective: Vital signs in last 24 hours: Filed Vitals:   06/17/12 0000 06/17/12 0328 06/17/12 0400 06/17/12 0733  BP: 185/67  160/65   Pulse: 70  52   Temp:  97.8 F (36.6 C)  97.9 F (36.6 C)  TempSrc:  Oral  Oral  Resp: 20  26   Height:      SpO2: 94%  93%    Weight change:   Intake/Output Summary (Last 24 hours) at 06/17/12 0834 Last data filed at 06/17/12 0733  Gross per 24 hour  Intake   1800 ml  Output   1600 ml  Net    200 ml  Vitals reviewed. General: resting in bed, NAD HEENT: PERRL, EOMI, no scleral icterus Cardiac: RRR, no rubs, murmurs or gallops Pulm: Clear to auscultation bilaterally, no wheezes, rales, or rhonchi, on 2L [place] Abd: Soft, nontender, nondistended, BS present Ext: Warm and well perfused, no pedal edema Neuro: Alert and oriented X3, Speech improved. Resolution of right eyelid droop and right lateral lip lag. Still with hearing deficits, rapid hand movements slow bilaterally R>L, but improving. Otherwise CN II-XII intact.  Lab Results: Basic Metabolic Panel:  Recent Labs Lab 06/16/12 0752 06/17/12 0522  NA 138 142  K 3.7 3.6  CL 103 107  CO2 27 25  GLUCOSE 97 98  BUN 15 16  CREATININE 0.81 0.77  CALCIUM 9.9 9.8  MG  --  1.9   Liver Function Tests:  Recent Labs Lab 06/13/12 0940 06/15/12 0405  AST 16 15  ALT 11 9  ALKPHOS 137* 127*  BILITOT 0.2* 0.5  PROT 6.9 6.6  ALBUMIN 3.2* 3.1*    Recent Labs Lab 06/13/12 1045  AMMONIA 20   CBC:  Recent Labs Lab 06/13/12 0940 06/13/12 0951 06/15/12 0405  WBC 6.4  --  5.8  NEUTROABS 5.1  --   --   HGB 11.7* 11.6* 11.3*  HCT 34.5* 34.0* 34.6*  MCV 82.3  --  82.6  PLT 270  --  273   Cardiac Enzymes:  Recent Labs Lab 06/13/12 0940   TROPONINI <0.30   CBG:  Recent Labs Lab 06/13/12 0853  GLUCAP 123*   Hemoglobin A1C:  Recent Labs Lab 06/14/12 0442  HGBA1C 5.5    Fasting Lipid Panel:  Recent Labs Lab 06/14/12 0442  CHOL 251*  HDL 47  LDLCALC 166*  TRIG 189*  CHOLHDL 5.3    Coagulation:  Recent Labs Lab 06/13/12 0940  LABPROT 13.4  INR 1.03   Urinalysis:  Recent Labs Lab 06/13/12 0925  COLORURINE YELLOW  LABSPEC 1.009  PHURINE 7.5  GLUCOSEU NEGATIVE  HGBUR NEGATIVE  BILIRUBINUR NEGATIVE  KETONESUR NEGATIVE  PROTEINUR NEGATIVE  UROBILINOGEN 0.2  NITRITE NEGATIVE  LEUKOCYTESUR NEGATIVE   Studies/Results: No results found. Medications: I have reviewed the patient's current medications. Scheduled Meds: . aspirin EC  81 mg Oral Daily  . atorvastatin  80 mg Oral q1800  . clopidogrel  75 mg Oral Q breakfast  . enoxaparin (LOVENOX) injection  40 mg Subcutaneous Q24H  . sodium chloride  500 mL Intravenous Once  . sodium chloride  3 mL Intravenous Q12H   Continuous Infusions:  PRN Meds:.sodium chloride, sodium chloride  Assessment/Plan: 79yo F with PMH HTN,  GERD, breast cancer s/p lumpectomy, and possible CHF present to the ED with dizziness, dysarthria, and difficulty ambulating.   Posterior circulation stroke:  Symptom onset possibly began over the past week with significant acute changes likely occurring overnight including dysarthria, decreased hearing, and ataxia- although she reports feeling unsteady on her feet over the past week. Her symptoms seem more consistent with a posterior circulation stroke. Neurology was consulted and recommended an MRI. Initial thoughts pointed toward CVA 2/2 HTN, given her history of uncontrolled HTN, with BP recorded as high as 199/66 on admission. 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 Carotid doppler and ECHO unremarkable for source of emboli. Lipid panel with elevated total cholesterol and LDL, Atorvastatin started this admission. She did experience multiple TIAs on 5/28 but none since. Neuro recommended complete bed rest for the patient with her lying flat for at least 24hrs. On 5/30 Neuro approved her getting out of bed. She passed another swallow study and was able to work with PT/OT. Plans to transfer her out of SDU and possibly d/c home tomorrow if we can get better control of her blood pressure. - Neuro checks q4h  - Vital signs q4h  - ASA 81mg  po daily and Plavix 75mg  po daily for 57mo, then one therapy alone. - PT/OT - F/u with Neurology, appreciate recommendations   HTN:  Chronic HTN, not taking medication but supposed to be on Diovan. Alllowed for permissive HTN overnight in the setting of her CVA. BP still elevated this morning. Started HCTZ after she passed her initial swallow study with speech. With the TIAs, were allowing for permissive HTN, so HCTZ d/c'd. Allowing for permissive HTN, per Neurology, but will touch base with them today regarding starting antihypertensives.  Hypokalemia: Resolved Potassium 3.2 on 5/29, 3.6 today. Likely from NPO status. Mg normal at 1.9. Replacing potassium. Recent Labs Lab 06/13/12 0940 06/13/12 0951 06/15/12 0405 06/16/12 0752 06/17/12 0522  K 4.0 4.0 3.2* 3.7 3.6   - po KCl today -AM BMP  DVT PPx:  Lovenox, SCDs   Dispo: Disposition is deferred at this time, awaiting improvement of current medical problems.  Anticipated discharge in approximately 1-2 day(s).   The patient does have a current PCP (SCHULTZ,DOUGLAS E, MD), therefore will be requiring OPC follow-up after discharge.   The patient does not have transportation limitations that hinder transportation to clinic appointments.  .Services Needed at time of discharge: Y = Yes, Blank = No PT:   OT:   RN:   Equipment:   Other:      LOS: 4 days   Genelle Gather 06/17/2012, 8:34 AM

## 2012-06-17 NOTE — Progress Notes (Signed)
Pt tx 4N, pt VSS, pt verbalized understanding of tx, family at [facility], report called to receiving RN, all questions answered

## 2012-06-18 LAB — BASIC METABOLIC PANEL
BUN: 13 mg/dL (ref 6–23)
CO2: 26 mEq/L (ref 19–32)
Calcium: 10.4 mg/dL (ref 8.4–10.5)
Chloride: 102 mEq/L (ref 96–112)
Creatinine, Ser: 0.8 mg/dL (ref 0.50–1.10)
GFR calc Af Amer: 80 mL/min — ABNORMAL LOW (ref >90)

## 2012-06-18 MED ORDER — WHITE PETROLATUM GEL
Status: AC
  Administered 2012-06-18: 0.2
  Filled 2012-06-18: qty 5

## 2012-06-18 MED ORDER — SODIUM CHLORIDE 0.9 % IV BOLUS (SEPSIS)
250.0000 mL | Freq: Once | INTRAVENOUS | Status: AC
  Administered 2012-06-19: 250 mL via INTRAVENOUS

## 2012-06-18 MED ORDER — SODIUM CHLORIDE 0.9 % IV SOLN
INTRAVENOUS | Status: DC
  Administered 2012-06-19: via INTRAVENOUS

## 2012-06-18 MED ORDER — AMLODIPINE BESYLATE 5 MG PO TABS
5.0000 mg | ORAL_TABLET | Freq: Every day | ORAL | Status: DC
  Administered 2012-06-18: 5 mg via ORAL
  Filled 2012-06-18: qty 1

## 2012-06-18 MED ORDER — AMLODIPINE BESYLATE 10 MG PO TABS
10.0000 mg | ORAL_TABLET | Freq: Every day | ORAL | Status: DC
  Filled 2012-06-18: qty 1

## 2012-06-18 NOTE — Progress Notes (Signed)
Stroke Team Progress Note  HISTORY Erin Branch is an 79 y.o. female, right handed, with a past medical history significant for hypertension, remote breast cancer, brought to [facility] ED due to new onset unsteadiness, vertigo, impaired hearing, and left face droopiness.  Never had similar symptoms before, but 3 days ago developed sudden onset vertigo and decreased hearing and when she woke up this morning she was severely off balance to the degree that needed help from her husband in order to walk and avoid falling. Additionally, she was noted to have slurred speech and some droopiness of the left face.  Denies headache, double vision, difficulty swallowing, focal weakness or numbness, confusion.  Family reports a recent upper respiratory infection.  Upon arrival to ED she had a CT brain that did not show acute intracranial abnormality.   Date last known well: 3 days ago  Time last known well: uncertain.   Patient was not a TPA candidate secondary to delay in arrival. She was admitted for further evaluation and treatment.  SUBJECTIVE   OBJECTIVE Most recent Vital Signs: Filed Vitals:   06/17/12 2304 06/18/12 0001 06/18/12 0200 06/18/12 0539  BP: 210/96 192/80 187/68 185/68  Pulse:   71 73  Temp:   98.6 F (37 C) 99.4 F (37.4 C)  TempSrc:    Oral  Resp:   20 20  Height:      Weight:      SpO2:   98% 97%   CBG (last 3)  No results found for this basename: GLUCAP,  in the last 72 hours IV Fluid Intake:     MEDICATIONS  . amLODipine  5 mg Oral Daily  . aspirin EC  81 mg Oral Daily  . atorvastatin  80 mg Oral q1800  . clopidogrel  75 mg Oral Q breakfast  . enoxaparin (LOVENOX) injection  40 mg Subcutaneous Q24H  . hydrochlorothiazide  25 mg Oral Daily  . sodium chloride  500 mL Intravenous Once  . sodium chloride  3 mL Intravenous Q12H   PRN:  sodium chloride, hydrALAZINE, sodium chloride  Diet:  Cardiac thin Activity: OOB with assistance DVT Prophylaxis:  Lovenox 40 mg sq  daily   CLINICALLY SIGNIFICANT STUDIES Basic Metabolic Panel:   Recent Labs Lab 06/16/12 0752 06/17/12 0522 06/18/12 0535  NA 138 142 137  K 3.7 3.6 3.7  CL 103 107 102  CO2 27 25 26   GLUCOSE 97 98 107*  BUN 15 16 13   CREATININE 0.81 0.77 0.80  CALCIUM 9.9 9.8 10.4  MG  --  1.9  --    Liver Function Tests:   Recent Labs Lab 06/13/12 0940 06/15/12 0405  AST 16 15  ALT 11 9  ALKPHOS 137* 127*  BILITOT 0.2* 0.5  PROT 6.9 6.6  ALBUMIN 3.2* 3.1*   CBC:   Recent Labs Lab 06/13/12 0940 06/13/12 0951 06/15/12 0405  WBC 6.4  --  5.8  NEUTROABS 5.1  --   --   HGB 11.7* 11.6* 11.3*  HCT 34.5* 34.0* 34.6*  MCV 82.3  --  82.6  PLT 270  --  273   Coagulation:   Recent Labs Lab 06/13/12 0940  LABPROT 13.4  INR 1.03   Cardiac Enzymes:   Recent Labs Lab 06/13/12 0940  TROPONINI <0.30   Urinalysis:   Recent Labs Lab 06/13/12 0925  COLORURINE YELLOW  LABSPEC 1.009  PHURINE 7.5  GLUCOSEU NEGATIVE  HGBUR NEGATIVE  BILIRUBINUR NEGATIVE  KETONESUR NEGATIVE  PROTEINUR NEGATIVE  UROBILINOGEN 0.2  NITRITE NEGATIVE  LEUKOCYTESUR NEGATIVE   Lipid Panel    Component Value Date/Time   CHOL 251* 06/14/2012 0442   TRIG 189* 06/14/2012 0442   HDL 47 06/14/2012 0442   CHOLHDL 5.3 06/14/2012 0442   VLDL 38 06/14/2012 0442   LDLCALC 166* 06/14/2012 0442   HgbA1C  Lab Results  Component Value Date   HGBA1C 5.5 06/14/2012   Urine Drug Screen:   No results found for this basename: labopia,  cocainscrnur,  labbenz,  amphetmu,  thcu,  labbarb    Alcohol Level: No results found for this basename: ETH,  in the last 168 hours  CT of the brain   06/13/2012    Scattered small acute infarcts noted within both cerebellar hemispheres and the left pons on recent MRI.  The appearance is most compatible with an embolic event.  The cerebellar infarcts are slightly better characterized on the current study; the left pontine infarct is only minimally visualized on CT.  No  evidence of hemorrhagic transformation.   06/13/2012    No evidence of acute intracranial abnormality.   MRI of the brain  06/13/2012    Note that the impression for the MRI of the brain should read: Small acute infarcts in the cerebellum bilaterally; small acute infarct in the left pons.    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  There is very little flow in the basilar which is diffusely diseased, accounting for acute bilateral cerebellar and left pontine infarcts  2D Echocardiogram  EF 60-65% with no source of embolus. No significant valvular abnormalities  Carotid Doppler  No evidence of hemodynamically significant internal carotid artery stenosis. Vertebral artery flow is antegrade.   CXR  06/13/2012   Left basilar scarring or atelectasis.  Mild hyperinflation.   EKG  normal sinus rhythm.   Therapy Recommendations home health PT and OT  Physical Exam   Frail elderly petite Caucasian lady sitting up in bed.Awake alert. Afebrile. Head is nontraumatic. Neck is supple without bruit. Hearing is normal. Cardiac exam no murmur or gallop. Lungs are clear to auscultation. Distal pulses are well felt.  Neurological Exam : Awake alert oriented x 3 dysarthric speech extraocular movements are full range without nystagmus but mild saccadic dysmetric on left gaze  . Mild left lower face asymmetry. Tongue midline. No drift. Mild diminished fine finger movements on left. Orbits right over left upper extremity. Mild left grip weak.. Normal sensation . Mildly impaired left finger-to-nose and knee to heel coordination. Gait deferred  ASSESSMENT Erin Branch is a 79 y.o. female presenting with ataxia, vertigo, decreased hearing, and left face weakness.  Imaging confirms bilateral cerebellar and a small left pontine infarct. 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 Waxing and waning of symptoms 06/14/2012 - 1230,  330, 340, 530, each lasting around 2-3 mins- described as eyes glazed over and unresponsive by dtrs - transferred to the ICU. Patient is at risk for neuro worsening given baseline severe posterior circulation disease.  On no antithrombotics prior to admission. Now on aspirin 81 mg orally every day and clopidogrel 75 mg orally every day for secondary stroke prevention, though pt currently NPO. Patient with resultant ataxia, dysathrtia, dysphagia, vertigo, left face hemiparesis.   Hypertension Hyperlipidemia, LDL 166, on no statin PTA, now on lipitor 80 mg daily, goal LDL < 100 (< 70 for diabetics)' Dysphagia, coughing with food intake 5/28. Cleared  by ST in the afternoon for reg diet, thin liquid Hx breast cancer Hypokalemia Patient passed swallow  Hospital day # 5  TREATMENT/PLAN  Given severely stenosis/hypoplastic posterior circulation, recommend continuation of aspirin 81 mg orally every day and clopidogrel 75 mg orally every day for secondary stroke prevention x 3 mos then one alone  Continue hydration  Improvement in electrolytes  Con't PT/Ot, pt passed speech eval  Can be d/c from Neuro standpoint when ready which is probably in the next few days.   B/P control as in AM BP is 197/75, Would like it below 160SBP  Please restart pt's home Diovan.   Delton See PA-C [facility] Pager [phone] 06/18/2012, 9:55 AM  Pt had episodes of diarrhea overnight that improved. BP not well controlled this AM. Pt and Pt's family are eager to be d/c. Once B/P better controlled feel safe to be d/c from Neuro stand point Will sign off please call with any questions.

## 2012-06-18 NOTE — Progress Notes (Signed)
Subjective: No further TIAs. Pt states that she is feeling good and wants to go home. Unfortunately her BP was elevated all last night, requiring IV hydralazine. Pt thinks her HTN overnight was due to a disagreement with a nurse tech.  Objective: Vital signs in last 24 hours: Filed Vitals:   06/18/12 0001 06/18/12 0200 06/18/12 0539 06/18/12 1000  BP: 192/80 187/68 185/68 191/73  Pulse:  71 73 72  Temp:  98.6 F (37 C) 99.4 F (37.4 C) 98.2 F (36.8 C)  TempSrc:   Oral Oral  Resp:  20 20 20   Height:      Weight:      SpO2:  98% 97% 99%   Weight change:   Intake/Output Summary (Last 24 hours) at 06/18/12 1248 Last data filed at 06/18/12 0000  Gross per 24 hour  Intake    720 ml  Output      2 ml  Net    718 ml  Vitals reviewed. General: Laying in bed, NAD HEENT: PERRL, EOMI, no scleral icterus Cardiac: RRR, no rubs, murmurs or gallops Pulm: Clear to auscultation bilaterally, no wheezes, rales, or rhonchi, on 2L [place] Abd: Soft, nontender, nondistended, BS present Ext: Warm and well perfused, no pedal edema Neuro: Alert and oriented X3, Speech improved. Still with hearing deficits, rapid hand movements slow bilaterally R>L, but stable. Otherwise CN II-XII intact.  Lab Results: Basic Metabolic Panel:  Recent Labs Lab 06/16/12 0752 06/17/12 0522 06/18/12 0535  NA 138 142 137  K 3.7 3.6 3.7  CL 103 107 102  CO2 27 25 26   GLUCOSE 97 98 107*  BUN 15 16 13   CREATININE 0.81 0.77 0.80  CALCIUM 9.9 9.8 10.4  MG  --  1.9  --    Liver Function Tests:  Recent Labs Lab 06/13/12 0940 06/15/12 0405  AST 16 15  ALT 11 9  ALKPHOS 137* 127*  BILITOT 0.2* 0.5  PROT 6.9 6.6  ALBUMIN 3.2* 3.1*    Recent Labs Lab 06/13/12 1045  AMMONIA 20   CBC:  Recent Labs Lab 06/13/12 0940 06/13/12 0951 06/15/12 0405  WBC 6.4  --  5.8  NEUTROABS 5.1  --   --   HGB 11.7* 11.6* 11.3*  HCT 34.5* 34.0* 34.6*  MCV 82.3  --  82.6  PLT 270  --  273   Cardiac Enzymes:  Recent  Labs Lab 06/13/12 0940  TROPONINI <0.30   CBG:  Recent Labs Lab 06/13/12 0853  GLUCAP 123*   Hemoglobin A1C:  Recent Labs Lab 06/14/12 0442  HGBA1C 5.5    Fasting Lipid Panel:  Recent Labs Lab 06/14/12 0442  CHOL 251*  HDL 47  LDLCALC 166*  TRIG 189*  CHOLHDL 5.3    Coagulation:  Recent Labs Lab 06/13/12 0940  LABPROT 13.4  INR 1.03   Urinalysis:  Recent Labs Lab 06/13/12 0925  COLORURINE YELLOW  LABSPEC 1.009  PHURINE 7.5  GLUCOSEU NEGATIVE  HGBUR NEGATIVE  BILIRUBINUR NEGATIVE  KETONESUR NEGATIVE  PROTEINUR NEGATIVE  UROBILINOGEN 0.2  NITRITE NEGATIVE  LEUKOCYTESUR NEGATIVE   Studies/Results: No results found. Medications: I have reviewed the patient's current medications. Scheduled Meds: . amLODipine  5 mg Oral Daily  . aspirin EC  81 mg Oral Daily  . atorvastatin  80 mg Oral q1800  . clopidogrel  75 mg Oral Q breakfast  . enoxaparin (LOVENOX) injection  40 mg Subcutaneous Q24H  . hydrochlorothiazide  25 mg Oral Daily  . sodium chloride  500  mL Intravenous Once  . sodium chloride  3 mL Intravenous Q12H   Continuous Infusions:  PRN Meds:.sodium chloride, hydrALAZINE, sodium chloride  Assessment/Plan: 79yo F with PMH HTN, GERD, breast cancer s/p lumpectomy, and possible CHF present to the ED with dizziness, dysarthria, and difficulty ambulating.   Posterior circulation stroke:  Symptom onset possibly began over the past week with significant acute changes likely occurring overnight including dysarthria, decreased hearing, and ataxia- although she reports feeling unsteady on her feet over the past week. Her symptoms seem more consistent with a posterior circulation stroke. Neurology was consulted and recommended an MRI. Initial thoughts pointed toward CVA 2/2 HTN, given her history of uncontrolled HTN, with BP recorded as high as 199/66 on admission. 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 Carotid doppler and ECHO unremarkable for source of emboli. Lipid panel with elevated total cholesterol and LDL, Atorvastatin started this admission. She did experience multiple TIAs on 5/28 but none since. Neuro recommended complete bed rest for the patient with her lying flat for at least 24hrs. On 5/30 Neuro approved her getting out of bed. She passed another swallow study and was able to work with PT/OT who recommend home health PT/OT. She was transfered out of SDU on 5/31 and continues to do well, except for her BP control. Once her BP is better controlled, we can send her home. - Neuro checks q4h  - Vital signs q4h  - ASA 81mg  po daily and Plavix 75mg  po daily for 57mo, then one therapy alone. - PT/OT - F/u with Neurology, appreciate recommendations   HTN:  Chronic HTN, not taking home medication but supposed to be on Diovan. Alllowed for permissive HTN in the setting of her CVA and TIAs. Started HCTZ on 5/31. BP remains elevated, up to 212 SBP, DBP stable. Starting Amlodipine 5mg . - HCTZ 25mg  po daily - Amlodipine 5mg  po daily   Hypokalemia: Resolved Potassium 3.2 on 5/29, 3.7 today. Likely from NPO status. Mg normal at 1.9.  Potassium replaced.  Recent Labs Lab 06/13/12 0951 06/15/12 0405 06/16/12 0752 06/17/12 0522 06/18/12 0535  K 4.0 3.2* 3.7 3.6 3.7    DVT PPx:  Lovenox, SCDs   Dispo: Disposition is deferred at this time, awaiting improvement of current medical problems.  Anticipated discharge in approximately 1-2 day(s).   The patient does have a current PCP (SCHULTZ,DOUGLAS E, MD), therefore will be requiring OPC follow-up after discharge.   The patient does not have transportation limitations that hinder transportation to clinic appointments.  .Services Needed at time of discharge: Y = Yes, Blank = No PT:   OT:   RN:   Equipment:    Other:     LOS: 5 days   Genelle Gather 06/18/2012, 12:48 PM

## 2012-06-18 NOTE — Progress Notes (Signed)
BP: 212/69, patient remained assymptomatic. Notified MD. Received PRN orders, See MAR. Will continue to monitor the patient.

## 2012-06-19 LAB — CBC WITH DIFFERENTIAL/PLATELET
Basophils Absolute: 0 10*3/uL (ref 0.0–0.1)
Basophils Relative: 0 % (ref 0–1)
Eosinophils Relative: 1 % (ref 0–5)
HCT: 37.2 % (ref 36.0–46.0)
MCHC: 32.8 g/dL (ref 30.0–36.0)
Monocytes Absolute: 0.5 10*3/uL (ref 0.1–1.0)
Neutro Abs: 5.1 10*3/uL (ref 1.7–7.7)
Platelets: 277 10*3/uL (ref 150–400)
RDW: 13.9 % (ref 11.5–15.5)

## 2012-06-19 LAB — BASIC METABOLIC PANEL
CO2: 24 mEq/L (ref 19–32)
Calcium: 10.3 mg/dL (ref 8.4–10.5)
Creatinine, Ser: 0.93 mg/dL (ref 0.50–1.10)
GFR calc Af Amer: 67 mL/min — ABNORMAL LOW (ref >90)
GFR calc non Af Amer: 58 mL/min — ABNORMAL LOW (ref >90)
Sodium: 137 mEq/L (ref 135–145)

## 2012-06-19 LAB — GLUCOSE, CAPILLARY: Glucose-Capillary: 128 mg/dL — ABNORMAL HIGH (ref 70–99)

## 2012-06-19 LAB — MAGNESIUM: Magnesium: 2.1 mg/dL (ref 1.5–2.5)

## 2012-06-19 MED ORDER — LOPERAMIDE HCL 2 MG PO CAPS
4.0000 mg | ORAL_CAPSULE | ORAL | Status: DC | PRN
  Filled 2012-06-19: qty 2

## 2012-06-19 MED ORDER — ATORVASTATIN CALCIUM 80 MG PO TABS: 80.0000 mg | ORAL_TABLET | Freq: Every day | ORAL | Status: DC

## 2012-06-19 MED ORDER — HYDROCHLOROTHIAZIDE 25 MG PO TABS: 25.0000 mg | ORAL_TABLET | Freq: Every day | ORAL | Status: DC

## 2012-06-19 MED ORDER — HYDROCHLOROTHIAZIDE 25 MG PO TABS
25.0000 mg | ORAL_TABLET | Freq: Every day | ORAL | Status: DC
  Administered 2012-06-19 – 2012-06-20 (×2): 25 mg via ORAL
  Filled 2012-06-19 (×2): qty 1

## 2012-06-19 MED ORDER — AMLODIPINE BESYLATE 5 MG PO TABS: 5.0000 mg | ORAL_TABLET | Freq: Every day | ORAL | Status: DC

## 2012-06-19 MED ORDER — CLOPIDOGREL BISULFATE 75 MG PO TABS: 75.0000 mg | ORAL_TABLET | Freq: Every day | ORAL | Status: DC

## 2012-06-19 MED ORDER — AMLODIPINE BESYLATE 5 MG PO TABS
5.0000 mg | ORAL_TABLET | Freq: Every day | ORAL | Status: DC
  Administered 2012-06-19: 5 mg via ORAL
  Filled 2012-06-19 (×2): qty 1

## 2012-06-19 MED ORDER — ASPIRIN 81 MG PO TBEC: 81.0000 mg | DELAYED_RELEASE_TABLET | Freq: Every day | ORAL | Status: DC

## 2012-06-19 NOTE — Progress Notes (Signed)
Occupational Therapy Treatment Patient Details Name: SHIR BERGMAN MRN: [id number] DOB: 02-02-1933 Today's Date: 06/19/2012 Time: 4098-1191 OT Time Calculation (min): 29 min  OT Assessment / Plan / Recommendation Comments on Treatment Session  Pt progressing very well with OT.  She requires supervision for BADLs.  Difficulty noted with visual pursuits to Lt. - will give HEP next session.     Follow Up Recommendations  Supervision/Assistance - 24 hour;Home health OT    Barriers to Discharge       Equipment Recommendations  None recommended by OT    Recommendations for Other Services    Frequency Min 3X/week   Plan Discharge plan remains appropriate    Precautions / Restrictions Precautions Precautions: Fall Restrictions Weight Bearing Restrictions: No   Pertinent Vitals/Pain     ADL  Upper Body Bathing: Simulated;Supervision/safety Where Assessed - Upper Body Bathing: Unsupported standing Lower Body Bathing: Simulated;Supervision/safety Where Assessed - Lower Body Bathing: Unsupported standing Toilet Transfer: Simulated;Supervision/safety Toilet Transfer Method: Sit to stand;Stand pivot Toilet Transfer Equipment: Comfort height toilet Toileting - Clothing Manipulation and Hygiene: Supervision/safety Where Assessed - Toileting Clothing Manipulation and Hygiene: Standing;Sit to stand from 3-in-1 or toilet Tub/Shower Transfer: Supervision/safety Tub/Shower Transfer Method: Ambulating Transfers/Ambulation Related to ADLs: supervision ADL Comments: Vision assesment:  Pt denies visual changes.  SHe wears glasses/contacts for farsightedness.  Pt unable to read small print due to not having glassess.  Able to read headlines.  EOMs full: Pursuits  Pt with difficulty sustaining gaze to Lt and Rt. inferior quadrant - has to initiate saccades frequently.  Pt aware of difficulty.  Convergence impaired to ~11" from nose; gaze stabillization WFL with no indication of dizziness.  Pt performed  simulated shower in standing with no dizziness, or LOB    OT Diagnosis:    OT Problem List:   OT Treatment Interventions:     OT Goals Acute Rehab OT Goals OT Goal Formulation: With patient Time For Goal Achievement: 06/28/12 Potential to Achieve Goals: Good ADL Goals Pt Will Perform Grooming: Unsupported;Standing at sink;with modified independence Pt Will Perform Upper Body Dressing: Independently;Sitting, chair;Sitting, bed Pt Will Perform Lower Body Dressing: with modified independence;Sit to stand from chair;Sit to stand from bed Pt Will Transfer to Toilet: with supervision;Ambulation;with DME;Regular height toilet ADL Goal: Toilet Transfer - Progress: Met Pt Will Perform Toileting - Clothing Manipulation: with modified independence;Standing Pt Will Perform Toileting - Hygiene: with modified independence;Sit to stand from 3-in-1/toilet Pt Will Perform Tub/Shower Transfer: Ambulation;with supervision ADL Goal: Tub/Shower Transfer - Progress: Goal set today Additional ADL Goal #1: Pt will be independent with HEP for visual deficit ADL Goal: Additional Goal #1 - Progress: Goal set today  Visit Information  Last OT Received On: 06/19/12 Assistance Needed: +1    Subjective Data      Prior Functioning       Cognition  Cognition Arousal/Alertness: Awake/alert Behavior During Therapy: WFL for tasks assessed/performed Overall Cognitive Status: Within Functional Limits for tasks assessed    Mobility  Bed Mobility Bed Mobility: Supine to Sit;Sitting - Scoot to Edge of Bed Supine to Sit: 7: Independent Sitting - Scoot to [place] of Bed: 7: Independent Transfers Transfers: Sit to Stand;Stand to Sit Sit to Stand: 5: Supervision;From bed    Exercises      Balance Berg Balance Test Sit to Stand: Able to stand without using hands and stabilize independently Standing Unsupported: Able to stand safely 2 minutes Sitting with Back Unsupported but Feet Supported on Floor or Stool:  Able to  sit safely and securely 2 minutes Stand to Sit: Sits safely with minimal use of hands Transfers: Able to transfer safely, minor use of hands Standing Unsupported with Eyes Closed: Able to stand 10 seconds safely Standing Ubsupported with Feet Together: Able to place feet together independently and stand 1 minute safely From Standing, Reach Forward with Outstretched Arm: Can reach confidently >25 cm (10") From Standing Position, Pick up Object from Floor: Able to pick up shoe safely and easily From Standing Position, Turn to Look Behind Over each Shoulder: Looks behind from both sides and weight shifts well Turn 360 Degrees: Able to turn 360 degrees safely in 4 seconds or less Standing Unsupported, Alternately Place Feet on Step/Stool: Able to complete >2 steps/needs minimal assist Standing Unsupported, One Foot in Front: Loses balance while stepping or standing Standing on One Leg: Able to lift leg independently and hold equal to or more than 3 seconds Total Score: 47   End of Session OT - End of Session Activity Tolerance: Patient tolerated treatment well Patient left: Other (comment) (with PT)  GO     Blakelynn Scheeler M 06/19/2012, 4:09 PM

## 2012-06-19 NOTE — Progress Notes (Signed)
[name] was signed off/independent to walk around, went to the bathroom for a bowel movement around 2320. After the bowel movement when she stood up she felt very weak and sweaty. Family helped patient to floor and called nurse. Nurse assessed the patient, alert and oriented, but weak, no bruises/wounds. Patient denies any pain. Assisted patient to the bed. Checked vitals and CBG, everything is within normal limits. MD notified. Received new orders, See MAR. Placed patient on bed alarm. Reeducated patient and family and they demonstrated the use of call bell and bed alarm. Call bell within reach. Family at bed side.

## 2012-06-19 NOTE — Progress Notes (Signed)
Patient ID: Jake Michaelis, female   DOB: 11-11-1933, 79 y.o.   MRN: [id number] Internal Medicine Attending  Date: 06/19/2012  Patient name: JULIANA BOLING Medical record number: [id number] Date of birth: 21-Jul-1933 Age: 79 y.o. Gender: female  I saw and evaluated the patient on AM rounds with house staff. I reviewed the resident's note by Dr. Shirlee Latch and I agree with the resident's findings and plans as documented in her note.  Patient reports that she is doing well today; she had an episode last evening In which she felt very weak and sweaty while up to the bathroom and had to be assisted to the floor by her family and then back to bed by staff.  She denies any injury or loss of consciousness during the event.  Today she has been up with assistance without similar symptoms.  Exam is notable for clear lungs; regular rhythm, no extra sounds or murmurs; no edema; neurologic exam shows cranial nerves intact; motor 5 over 5 throughout; finger to nose shows slight tremor on left.  Plans include continue OT/PT; check orthostatics and follow blood pressure, adjust antihypertensive regimen as indicated; continue antiplatelet therapy.  We discussed advance directives with patient, and she confirmed her decision for DO NOT RESUSCITATE CODE STATUS which will be continued in accord with her wishes.

## 2012-06-19 NOTE — Care Management Note (Unsigned)
    Page 1 of 1   06/19/2012     12:03:26 PM   CARE MANAGEMENT NOTE 06/19/2012  Patient:  MARIAGUADALUPE, FIALKOWSKI   Account Number:  [id number]  Date Initiated:  06/19/2012  Documentation initiated by:  Elmer Bales  Subjective/Objective Assessment:   Pt admitted for slurred speech, vertigo, positive for CVA. Pt lives at home with daughter.     Action/Plan:   Will follow for home health needs,  PT/OT and equipment   Anticipated DC Date:  06/20/2012   Anticipated DC Plan:  HOME W HOME HEALTH SERVICES      DC Planning Services  CM consult      Choice offered to / List presented to:  C-1 Patient   DME arranged  Levan Hurst      DME agency  [facility]     [facility] arranged  HH-3 OT  HH-2 PT      Status of service:  In process, will continue to follow Medicare Important Message given?   (If response is "NO", the following Medicare IM given date fields will be blank) Date Medicare IM given:   Date Additional Medicare IM given:    Discharge Disposition:    Per UR Regulation:  Reviewed for med. necessity/level of care/duration of stay  If discussed at Long Length of Stay Meetings, dates discussed:    Comments:  06/19/12 1130 Elmer Bales RN, MSN- Met with patient and family to discuss discharge needs.  Pt and family chose to use [facility].  Spoke with Eber Jones regarding referral and anticipated discharge.  Demographics and H+P were faxed to [phone].  Discharge summary will be faxed at discharge.

## 2012-06-19 NOTE — Progress Notes (Signed)
Physical Therapy Treatment Patient Details Name: Erin Branch MRN: [id number] DOB: 11-12-1933 Today's Date: 06/19/2012 Time: 4098-1191 PT Time Calculation (min): 30 min  PT Assessment / Plan / Recommendation Comments on Treatment Session  79 y.o. female admitted to [facility] with acute small infarcts in the cerebellum and pons.  She presents today doing much better with her mobility.  We preformed the Berg Balance test and she scored a 47/56 putting her at moderate risk of falls (50%).  I educated the patient and her family about what this means for her at home functionally.  We continue to recommend f/u PT at discharge.  She is more appropriate for outpatient at this point in time.      Follow Up Recommendations  Outpatient PT;Supervision - Intermittent     Does the patient have the potential to tolerate intense rehabilitation    NA  Barriers to Discharge   none      Equipment Recommendations  Rolling walker with 5" wheels    Recommendations for Other Services   none  Frequency Min 4X/week   Plan Discharge plan needs to be updated;Frequency needs to be updated    Precautions / Restrictions Precautions Precautions: Fall Restrictions Weight Bearing Restrictions: No   Pertinent Vitals/Pain VSS    Mobility  Bed Mobility Bed Mobility: Supine to Sit;Sitting - Scoot to Edge of Bed Supine to Sit: 7: Independent Sitting - Scoot to [place] of Bed: 7: Independent Transfers Sit to Stand: 6: Modified independent (Device/Increase time) Stand to Sit: 6: Modified independent (Device/Increase time) Stand Pivot Transfers: 6: Modified independent (Device/Increase time) Details for Transfer Assistance: Pt relying on hands for supoort during transitional movements.   Ambulation/Gait Ambulation/Gait Assistance: 5: Supervision Ambulation Distance (Feet): 400 Feet Assistive device: Rolling walker;None Ambulation/Gait Assistance Details: walked 200' with RW mod I, and 200' without RW supervision.  Slower  gait speed and more caution without RW.   Gait Pattern: Step-through pattern Stairs: Yes Stairs Assistance: 5: Supervision Stairs Assistance Details (indicate cue type and reason): supervision for safety Stair Management Technique: One rail Right;Alternating pattern;Forwards Number of Stairs: 5     PT Goals Acute Rehab PT Goals Pt will go Sit to Stand: with modified independence PT Goal: Sit to Stand - Progress: Updated due to goal met Pt will Ambulate: >150 feet;with modified independence;with least restrictive assistive device PT Goal: Ambulate - Progress: Updated due to goal met Pt will Go Up / Down Stairs: 3-5 stairs;with modified independence;with least restrictive assistive device PT Goal: Up/Down Stairs - Progress: Updated due to goal met Additional Goals Additional Goal #1: Berg >52/56 to show significant improvement in balance and decreased risk of falls.  PT Goal: Additional Goal #1 - Progress: Goal set today  Visit Information  Last PT Received On: 06/19/12 Assistance Needed: +1 PT/OT Co-Evaluation/Treatment: Yes    Subjective Data  Subjective: Pt reports she wants to be able to go outside to care for her horse in the barn.   Patient Stated Goal: To go home   Cognition  Cognition Arousal/Alertness: Awake/alert Behavior During Therapy: WFL for tasks assessed/performed Overall Cognitive Status: Within Functional Limits for tasks assessed    Balance  Berg Balance Test Sit to Stand: Able to stand without using hands and stabilize independently Standing Unsupported: Able to stand safely 2 minutes Sitting with Back Unsupported but Feet Supported on Floor or Stool: Able to sit safely and securely 2 minutes Stand to Sit: Sits safely with minimal use of hands Transfers: Able to transfer safely,  minor use of hands Standing Unsupported with Eyes Closed: Able to stand 10 seconds safely Standing Ubsupported with Feet Together: Able to place feet together independently and  stand 1 minute safely From Standing, Reach Forward with Outstretched Arm: Can reach confidently >25 cm (10") From Standing Position, Pick up Object from Floor: Able to pick up shoe safely and easily From Standing Position, Turn to Look Behind Over each Shoulder: Looks behind from both sides and weight shifts well Turn 360 Degrees: Able to turn 360 degrees safely in 4 seconds or less Standing Unsupported, Alternately Place Feet on Step/Stool: Able to complete >2 steps/needs minimal assist Standing Unsupported, One Foot in Front: Loses balance while stepping or standing Standing on One Leg: Able to lift leg independently and hold equal to or more than 3 seconds Total Score: 47  End of Session PT - End of Session Activity Tolerance: Patient limited by fatigue Patient left: in bed;with call bell/phone within reach;with family/visitor present;Other (comment) (seated EOB)    Reyaansh Merlo B. Roxana Lai, PT, DPT [phone]   06/19/2012, 4:33 PM

## 2012-06-19 NOTE — Progress Notes (Signed)
Subjective: Patient states she had 15 episodes of yellow loose bowel movement yesterday.  She also had some rectal bleeding and has a history of hemorrhoids.  She had one bowel movement this am and did not note any bleeding.  She reports she has not follow up with her PCP in 4 years. Family also reports an episode ~11:30 PM where she was in the rest room and felt sweating and lightheaded and son states she was like dead weight and could not stand on her own.  Night team d/c Norvasc 5 and HCTZ 25 mg and checked a C. Diff.  Family states she is doing well this am and her speech is not as slurred.    Objective: Vital signs in last 24 hours: Filed Vitals:   06/19/12 0511 06/19/12 1000 06/19/12 1001 06/19/12 1002  BP: 168/55 154/54 169/67 154/84  Pulse: 62 68 69 84  Temp: 98.3 F (36.8 C) 98.5 F (36.9 C)    TempSrc: Oral Oral    Resp: 17 18 18 18   Height:      Weight:      SpO2: 95% 96% 99% 98%   Weight change:  No intake or output data in the 24 hours ending 06/19/12 1309 Vitals reviewed. General: resting in bed, NAD, alert and oriented x3  HEENT: [place]/at,no scleral icterus Cardiac: RRR, no rubs, murmurs or gallops Pulm: clear to auscultation bilaterally, no wheezes, rales, or rhonchi Abd: soft, nontender, nondistended, BS present Ext: warm and well perfused, no pedal edema Neuro: alert and oriented X3, CN 2-12 grossly intact, 5/5 strength upper and lower extremities b/l, left finger to nose slight abnormal   Lab Results: Basic Metabolic Panel:  Recent Labs Lab 06/17/12 0522 06/18/12 0535 06/19/12 0755  NA 142 137 137  K 3.6 3.7 4.0  CL 107 102 100  CO2 25 26 24   GLUCOSE 98 107* 141*  BUN 16 13 19   CREATININE 0.77 0.80 0.93  CALCIUM 9.8 10.4 10.3  MG 1.9  --  2.1   Liver Function Tests:  Recent Labs Lab 06/13/12 0940 06/15/12 0405  AST 16 15  ALT 11 9  ALKPHOS 137* 127*  BILITOT 0.2* 0.5  PROT 6.9 6.6  ALBUMIN 3.2* 3.1*    Recent Labs Lab 06/13/12 1045   AMMONIA 20   CBC:  Recent Labs Lab 06/13/12 0940  06/15/12 0405 06/19/12 1108  WBC 6.4  --  5.8 7.7  NEUTROABS 5.1  --   --  5.1  HGB 11.7*  < > 11.3* 12.2  HCT 34.5*  < > 34.6* 37.2  MCV 82.3  --  82.6 83.0  PLT 270  --  273 277  < > = values in this interval not displayed. Cardiac Enzymes:  Recent Labs Lab 06/13/12 0940  TROPONINI <0.30   CBG:  Recent Labs Lab 06/13/12 0853 06/18/12 2330  GLUCAP 123* 128*   Hemoglobin A1C:  Recent Labs Lab 06/14/12 0442  HGBA1C 5.5    Fasting Lipid Panel:  Recent Labs Lab 06/14/12 0442  CHOL 251*  HDL 47  LDLCALC 166*  TRIG 189*  CHOLHDL 5.3    Coagulation:  Recent Labs Lab 06/13/12 0940  LABPROT 13.4  INR 1.03   Urinalysis:  Recent Labs Lab 06/13/12 0925  COLORURINE YELLOW  LABSPEC 1.009  PHURINE 7.5  GLUCOSEU NEGATIVE  HGBUR NEGATIVE  BILIRUBINUR NEGATIVE  KETONESUR NEGATIVE  PROTEINUR NEGATIVE  UROBILINOGEN 0.2  NITRITE NEGATIVE  LEUKOCYTESUR NEGATIVE   Studies/Results: No results found. Medications: I  have reviewed the patient's current medications. Scheduled Meds: . amLODipine  5 mg Oral Daily  . aspirin EC  81 mg Oral Daily  . atorvastatin  80 mg Oral q1800  . clopidogrel  75 mg Oral Q breakfast  . enoxaparin (LOVENOX) injection  40 mg Subcutaneous Q24H  . hydrochlorothiazide  25 mg Oral Daily  . sodium chloride  3 mL Intravenous Q12H   Continuous Infusions:  PRN Meds:.hydrALAZINE, loperamide, sodium chloride  Assessment/Plan: 79yo F with PMH HTN, GERD, breast cancer s/p lumpectomy, presents to the ED with dizziness, dysarthria, and difficulty ambulating found to have acute infarctions.    1. Acute small infarcts in cerebellar hemispheres and left pons  -MRI with distal vertebral artery occluded b/l with very little flow in the basilar which is diffusely diseased accounting for acute b/l cerebellar and left pontine infarcts.  -Associated with dysarthria, balance issues, left  gaze, left lower face asymmetry, mild left hand grip, left finger to nose abnormal.   All symptoms improved except for left finger to nose slightly abnormal.   -BP has been an issue and she has been hypertensive.  We allowed for permissive HTN.  Now on HCTZ 25 and Norvasc 5 mg. Will not resume Diovan as she was not taking that medication outpatient - Neuro checks q4h  - Vital signs q4h  - ASA 81mg  po daily and Plavix 75mg  po daily for 70mo, then one therapy alone per neuro.  - PT/OT-rec rolling walker. Home health PT/OT - F/u with Neurology outpatient, appreciate recommendations  -f/u with [facility] outpatient x 1 and then patient to decide a PCP to follow up she may establish with Dr. Sherrine Maples  2. HTN, chronic   -Noncompliant with Diovan outpatient prior to admission.   -Initially alllowed for permissive HTN in the setting of her CVA and TIAs. Started HCTZ on 5/31. BP remained elevated so also started Amlodipine 5mg  -BP is now more controlled this am 168/55.  Range over 24 hours 130s-190s/50s-80s  3. Diarrhea  -C. Diff negative.  Her daughter also had recent GI symptoms.   -prn Imodium 4 mg x 1  4. Concern for urinary retention  -Bladder scan with 290 cc but patient has not voided, pending post void residual -RN to repeat bladder scan and PVR once patient voids  -if >250 cc call MD  5. DVT PPx -Lovenox, SCDs  6. F/E/N -NSL -will monitor and check electrolytes prn -heart diet   7. Other -DNR/I:discussed code status with family at bedside patient does not want cardiac resuscitation or intubation -she will need outpatient colonoscopy and f/u mammogram with h/o breast cancer and she has been lost to follow up.  -H/H ordered for home PT/OT services at discharge and case manager consulted. Family agreeable to assist patient at home  Dispo: Disposition is deferred at this time, awaiting improvement of current medical problems.  Anticipated discharge in approximately 1 day(s).   The patient does  have a current PCP (SCHULTZ,DOUGLAS E, MD), therefore will be requiring OPC follow-up after discharge.   The patient does not have transportation limitations that hinder transportation to clinic appointments.  Services Needed at time of discharge: Y = Yes, Blank = No PT: Rolling walker, H/H PT  OT: H/H OT  RN:   Equipment: Rolling walker  Other:     LOS: 6 days   Annett Gula [phone] 06/19/2012, 1:09 PM

## 2012-06-20 ENCOUNTER — Telehealth: Payer: Self-pay | Admitting: *Deleted

## 2012-06-20 MED ORDER — AMLODIPINE BESYLATE 10 MG PO TABS
10.0000 mg | ORAL_TABLET | Freq: Every day | ORAL | Status: DC
  Administered 2012-06-20: 10 mg via ORAL
  Filled 2012-06-20: qty 1

## 2012-06-20 MED ORDER — ATORVASTATIN CALCIUM 80 MG PO TABS: 80.0000 mg | ORAL_TABLET | Freq: Every day | ORAL | Status: DC

## 2012-06-20 MED ORDER — ASPIRIN 81 MG PO TBEC: 81.0000 mg | DELAYED_RELEASE_TABLET | Freq: Every day | ORAL | Status: DC

## 2012-06-20 MED ORDER — HYDROCHLOROTHIAZIDE 25 MG PO TABS: 25.0000 mg | ORAL_TABLET | Freq: Every day | ORAL | Status: DC

## 2012-06-20 MED ORDER — CLOPIDOGREL BISULFATE 75 MG PO TABS: 75.0000 mg | ORAL_TABLET | Freq: Every day | ORAL | Status: DC

## 2012-06-20 MED ORDER — AMLODIPINE BESYLATE 5 MG PO TABS: 10.0000 mg | ORAL_TABLET | Freq: Every day | ORAL | Status: DC

## 2012-06-20 MED ORDER — AMLODIPINE BESYLATE 10 MG PO TABS: 10.0000 mg | ORAL_TABLET | Freq: Every day | ORAL | Status: DC

## 2012-06-20 NOTE — Progress Notes (Addendum)
Occupational Therapy Treatment Patient Details Name: Erin Branch MRN: [id number] DOB: 16-Apr-1933 Today's Date: 06/20/2012 Time: [id number] OT Time Calculation (min): 11 min  OT Assessment / Plan / Recommendation Comments on Treatment Session  Pt provided with HEP for vision, and returned demonstration independently.  Pt is now performing all BADLs with supervision, and anticipate she will progress quickly to independent level.  No follow up OT recommended at discharge    Follow Up Recommendations  Supervision/Assistance - 24 hour    Barriers to Discharge       Equipment Recommendations  None recommended by OT    Recommendations for Other Services    Frequency Min 3X/week   Plan Discharge plan needs to be updated    Precautions / Restrictions Precautions Precautions: Fall Restrictions Weight Bearing Restrictions: No   Pertinent Vitals/Pain     ADL  ADL Comments: Pt was provided with HEP for vision and was able to return demonstration.  Pt is now at supervision level with BADLs    OT Diagnosis:    OT Problem List:   OT Treatment Interventions:     OT Goals Acute Rehab OT Goals OT Goal Formulation: With patient Time For Goal Achievement: 06/28/12 Potential to Achieve Goals: Good ADL Goals Additional ADL Goal #1: Pt will be independent with HEP for visual deficit ADL Goal: Additional Goal #1 - Progress: Met  Visit Information  Last OT Received On: 06/20/12 Assistance Needed: +1    Subjective Data      Prior Functioning       Cognition  Cognition Arousal/Alertness: Awake/alert Behavior During Therapy: WFL for tasks assessed/performed Overall Cognitive Status: Within Functional Limits for tasks assessed    Mobility  Bed Mobility Bed Mobility: Supine to Sit;Sitting - Scoot to Edge of Bed Supine to Sit: 7: Independent Sitting - Scoot to [place] of Bed: 7: Independent    Exercises      Balance     End of Session OT - End of Session Activity Tolerance:  Patient tolerated treatment well Patient left: in bed;with call bell/phone within reach;with family/visitor present  GO     Erin Branch, Erin Branch 06/20/2012, 12:17 PM

## 2012-06-20 NOTE — Discharge Summary (Signed)
Internal Medicine Teaching [facility] Discharge Note  Name: Erin Branch MRN: [id number] DOB: [birth year] 79 y.o.  Date of Admission: 06/13/2012  8:25 AM Date of Discharge: 06/20/2012 Attending Physician: Farley Ly, MD  Discharge Diagnosis: 1. 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 Uncontrolled Hypertension, chronic associated with medication noncompliance 3. Diarrhea, resolved 4. Urinary retention, improving 5. Hypokalemia, resolved.   Discharge Medications:   Medication List    STOP taking these medications       meclizine 25 MG tablet  Commonly known as:  ANTIVERT     valsartan 160 MG tablet  Commonly known as:  DIOVAN      TAKE these medications       amLODipine 10 MG tablet  Commonly known as:  NORVASC  Take 1 tablet (10 mg total) by mouth daily.     aspirin 81 MG EC tablet  Take 1 tablet (81 mg total) by mouth daily.     atorvastatin 80 MG tablet  Commonly known as:  LIPITOR  Take 1 tablet (80 mg total) by mouth daily at 6 PM.     clopidogrel 75 MG tablet  Commonly known as:  PLAVIX  Take 1 tablet (75 mg total) by mouth daily with breakfast.     hydrochlorothiazide 25 MG tablet  Commonly known as:  HYDRODIURIL  Take 1 tablet (25 mg total) by mouth daily.        Disposition and follow-up:   Erin Branch was discharged from [facility] in stable condition.  At the hospital follow up visit please address  1) Referral for outpatient colonoscopy and mammogram 2) medication compliance 3)repeat BMET, CBC   Follow-up Appointments:      Discharge Orders   Future Appointments Provider Department Dept Phone   06/26/2012 1:45 PM Na Dierdre Searles, MD [facility] [phone]   Future Orders Complete By Expires     Diet - low sodium heart healthy  As directed     Discharge instructions  As directed     Comments:      Please follow up with Internal Medicine 06/26/12 at 1:45   Pick up medications from [facility]   Review all information and take medication as prescribed   [facility] will call you with an appointment. If not call them [phone]    Increase activity slowly  As directed        Consultations:  1) Neurology-Dr. Pearlean Brownie, Dr. Georga Hacking  Procedures Performed:  Dg Chest 2 View  06/13/2012   *RADIOLOGY REPORT*  Clinical Data: Dizziness.  CHEST - 2 VIEW  Comparison: 05/05/2004  Findings: Mild hyperinflation of the lungs.  Linear scarring or atelectasis at the left base.  Right lung is clear.  No effusions or acute bony abnormality.  IMPRESSION: Left basilar scarring or atelectasis.  Mild hyperinflation.   Original Report Authenticated By: Charlett Nose, M.D.   Ct Head Wo Contrast  06/13/2012   *RADIOLOGY REPORT*  Clinical Data: Acute left pontine infarct.  Persistent somnolence. Assess for progression of infarct.  CT HEAD WITHOUT CONTRAST  Technique:  Contiguous axial images were obtained from the base of the skull through the vertex without contrast.  Comparison: CT of the head and MRI of the brain performed earlier today at  10:23 a.m. and 03:55 p.m.  Findings:   Scattered small acute infarcts were noted on MRI within both cerebellar hemispheres and the left pons; the appearance is most compatible with an embolic event.  The cerebellar infarcts are slightly better characterized on the current study.  The left pontine infarct is only minimally visualized.  There is no evidence of hemorrhagic transformation.  No mass effect is seen.  Minimal periventricular white matter change likely reflects small vessel ischemic microangiopathy.  A prominent focus of calcification is noted at the left globus pallidus.  The third and lateral ventricles, and  basal ganglia are unremarkable in appearance.  The cerebral hemispheres are symmetric in appearance, with normal gray-white differentiation.  No mass effect or midline shift is seen.  There is no evidence of fracture; visualized osseous structures are unremarkable in appearance.  The visualized portions of the orbits are within normal limits.  The paranasal sinuses and mastoid air cells are well-aerated.  No significant soft tissue abnormalities are seen.  IMPRESSION: Scattered small acute infarcts noted within both cerebellar hemispheres and the left pons on recent MRI.  The appearance is most compatible with an embolic event.  The cerebellar infarcts are slightly better characterized on the current study; the left pontine infarct is only minimally visualized on CT.  No evidence of hemorrhagic transformation.   Original Report Authenticated By: Tonia Ghent, M.D.   Ct Head Wo Contrast  06/13/2012   *RADIOLOGY REPORT*  Clinical Data: Dizziness, slurred speech, left facial droop  CT HEAD WITHOUT CONTRAST  Technique:  Contiguous axial images were obtained from the base of the skull through the vertex without contrast.  Comparison: None.  Findings: No evidence of parenchymal hemorrhage or extra-axial fluid collection. No mass lesion, mass effect, or midline shift.  No CT evidence of acute infarction.  Mild subcortical white matter and periventricular small vessel ischemic changes.  Intracranial atherosclerosis.  Cerebral volume is age appropriate.  No ventriculomegaly.  The visualized paranasal sinuses are essentially clear. The mastoid air cells are unopacified.  No evidence of calvarial fracture.  IMPRESSION: No evidence of acute intracranial abnormality.   Original Report Authenticated By: Charline Bills, M.D.   Mr [facility] Wo Contrast  06/13/2012   **ADDENDUM** CREATED: 06/13/2012 22:48:43  Note that the impression for the MRI of the brain should read: Small acute infarcts in the cerebellum bilaterally;  small acute infarct in the left pons.  **END ADDENDUM** SIGNED BY: Tonia Ghent, M.D.  06/13/2012   *RADIOLOGY REPORT*  Clinical Data:  Dizziness.  Slurred speech.  MRI HEAD WITHOUT CONTRAST MRA HEAD WITHOUT CONTRAST  Technique:  Multiplanar, multiecho pulse sequences of the brain and surrounding structures were obtained without intravenous contrast. Angiographic images of the head were obtained using MRA technique without contrast.  Comparison:  CT head 06/13/2012  MRI HEAD  Findings:  Multiple small areas of acute infarct in the cerebellum bilaterally.  Small acute infarct in the left pons.  No acute infarct above the tentorium.  Ventricle size is normal.  Minimal chronic microvascular ischemic change in the cerebral white matter.  Negative for hemorrhage or mass lesion.  Increased CSF over the convexity felt to be due to parietal atrophy. Asymmetric calcification left basal ganglia.  IMPRESSION: Chronic infarcts in the cerebellum bilaterally.  Acute infarct left pons.  MRA HEAD  Findings: Distal vertebral artery is diffusely diseased and ends in pica bilaterally.  Vertebral arteries do not appear to contribute to the basilar.  There is minimal flow in  the mid basilar which appears diffusely diseased.  This is most likely fed by collateral circulation.  There is fetal origin of the posterior cerebral artery bilaterally which are widely patent.  Cerebellar arteries are not well seen likely due to slow flow and/or occlusion.  Internal carotid artery is patent bilaterally without significant stenosis.  Anterior  and middle cerebral arteries are patent bilaterally.  There is mild atherosclerotic disease involving middle cerebral artery branches bilaterally.  No aneurysm is identified.  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  There is very little flow in the basilar which is diffusely diseased, accounting for acute bilateral cerebellar and left pontine infarcts.  Original Report  Authenticated By: Janeece Riggers, M.D.   Mr Brain Wo Contrast  06/13/2012   **ADDENDUM** CREATED: 06/13/2012 22:48:43  Note that the impression for the MRI of the brain should read: Small acute infarcts in the cerebellum bilaterally; small acute infarct in the left pons.  **END ADDENDUM** SIGNED BY: Tonia Ghent, M.D.  06/13/2012   *RADIOLOGY REPORT*  Clinical Data:  Dizziness.  Slurred speech.  MRI HEAD WITHOUT CONTRAST MRA HEAD WITHOUT CONTRAST  Technique:  Multiplanar, multiecho pulse sequences of the brain and surrounding structures were obtained without intravenous contrast. Angiographic images of the head were obtained using MRA technique without contrast.  Comparison:  CT head 06/13/2012  MRI HEAD  Findings:  Multiple small areas of acute infarct in the cerebellum bilaterally.  Small acute infarct in the left pons.  No acute infarct above the tentorium.  Ventricle size is normal.  Minimal chronic microvascular ischemic change in the cerebral white matter.  Negative for hemorrhage or mass lesion.  Increased CSF over the convexity felt to be due to parietal atrophy. Asymmetric calcification left basal ganglia.  IMPRESSION: Chronic infarcts in the cerebellum bilaterally.  Acute infarct left pons.  MRA HEAD  Findings: Distal vertebral artery is diffusely diseased and ends in pica bilaterally.  Vertebral arteries do not appear to contribute to the basilar.  There is minimal flow in the mid basilar which appears diffusely diseased.  This is most likely fed by collateral circulation.  There is fetal origin of the posterior cerebral artery bilaterally which are widely patent.  Cerebellar arteries are not well seen likely due to slow flow and/or occlusion.  Internal carotid artery is patent bilaterally without significant stenosis.  Anterior  and middle cerebral arteries are patent bilaterally.  There is mild atherosclerotic disease involving middle cerebral artery branches bilaterally.  No aneurysm is identified.   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  There is very little flow in the basilar which is diffusely diseased, accounting for acute bilateral cerebellar and left pontine infarcts.  Original Report Authenticated By: Janeece Riggers, M.D.    2D Echo:  06/14/12 Study Conclusions  - Left ventricle: The cavity size was normal. Wall thickness was normal. Systolic function was normal. The estimated ejection fraction was in the range of 60% to 65%. Wall motion was normal; there were no regional wall motion abnormalities. Doppler parameters are consistent with abnormal left ventricular relaxation (grade 1 diastolic dysfunction). - Aortic valve: There was no stenosis. - Mitral valve: Mildly calcified annulus. Mildly calcified leaflets . Trivial regurgitation. - Left atrium: The atrium was mildly dilated. - Right ventricle: The cavity size was normal. Systolic function was normal. - Tricuspid valve: Peak RV-RA gradient: 24mm Hg (S). - Pulmonary arteries: PA peak pressure: 29mm Hg (S). - Inferior vena cava: The vessel was normal in size;  the respirophasic diameter changes were in the normal range (= 50%); findings are consistent with normal central venous pressure. Impressions:  - Normal LV size and systolic function, EF 60-65%. Normal RV size and systolic function. No significant valvular abnormalities.    Cardiac Cath: none   Admission HPI:  Chief Complaint: Dysarthria, dizziness, and difficulty ambulating  History of Present Illness:  79yo F with PMH HTN, GERD, breast cancer s/p lumpectomy, and possible CHF present to the ED with dizziness, dysarthria, and difficulty ambulating.  Per pt, she has bee complaining of dizziness and congestion in her head intermittently over past week. Per her daughter, she had a fever 1 week ago and then began c/o of vertigo. She was also experiencing congestion in her head, which she attributed to allergies. Around the same time, she  was having trouble walking and needed her husbands help ambulating. Per her daughter, she took motion sickness medication last night and was last seen "normal" around 9pm. This morning, per her husband, she woke up and her speech was off; he states that she seemed to have trouble finding her words, but her speech was also slurred. She attempted to walk to the bathroom but almost fell and was caught by her husband. He says the helped her to the bathroom and then put her back in bed. The son came over to the house and noticed a left sided facial droop, which had mostly resolved once she arrived to the ED.  Per the patient's daughters, she has a h/o HTN and is supposed to be on medication, but has not been taking any and has not needed to go to the doctor for anything.  She does also endorse diminished hearing from both ears. She denies confusion, difficulty swallowing, headaches, neck pain, changes in vision, or numbness, weakness, or tingling in her bilateral upper and lower extremities.   Review of Systems:  A 10 point ROS was performed; pertinent positives and negatives were noted in the HPI  Physical Exam:  Blood pressure 182/65, pulse 60, temperature 98.4 F (36.9 C), temperature source Oral, resp. rate 16, SpO2 100.00%.  General: Alert, well-developed, and cooperative on examination.  Head: Normocephalic and atraumatic.  Eyes: EOMI, Pupils equal, round, and reactive to light, no injection and anicteric.  Mouth: Pharynx pink and moist, no erythema or exudates.  Neck: Supple, full ROM, no thyromegaly  Lungs: CTAB, normal respiratory effort, no accessory muscle use, no crackles, and no wheezes. Heart: Regular rate, regular rhythm, no murmur, no gallop, and no rub.  Abdomen: Soft, non-tender, non-distended, normal bowel sounds, no guarding, no rebound tenderness, no organomegaly.  Msk: No joint swelling, warmth, or erythema.  Extremities: 2+ radial and DP pulses bilaterally. No cyanosis, clubbing,  edema Neurologic: Alert & oriented X3, horizontal nystagmus when looking to the right or left. Speech is slurred, abnormal rapid finger movement with right hand, abnormal finer to nose with the left hand, difficulty with rapid hand movement exercise, L>R. Mild facial droop at corner of lip on right side (normal on the left), diminished hearing, requesting repetition of questions, otherwise cranial nerves appear intact  Skin: Turgor normal and no rashes.  Psych: Normal mood and affect. Memory intact for recent and remote, normally interactive, good eye contact, not anxious appearing, and not depressed appearing.   Hospital Course by problem list: 1. 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 Uncontrolled Hypertension, chronic associated with medication noncompliance 3. Diarrhea, resolved 4. Urinary retention, improving 5. Hypokalemia, resolved.    79 year old female with history of uncontrolled hypertension x 40 years and intermittently compliant with medications, GERD, breast cancer status post lumpectomy.  She presented to the Emergency Department with dizziness, dysarthria, and difficulty ambulating found to have acute infarctions.   1. 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  CT was negative.  MRI with distal vertebral artery occluded bilaterally with very little flow in the basilar which is diffusely diseased accounting for acute bilateral cerebellar and left pontine infarcts.  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  Associated with dysarthria, balance issues, left gaze, left lower face  asymmetry, mild left hand grip, left finger to nose abnormal. All symptoms improved except for left finger to nose slightly abnormal by discharge.  Blood pressure has been an issue and she has been hypertensive this admission. We allowed for permissive hypertension initially and at discharge she was on HCTZ 25 and Norvasc 10 mg. Will did not resume Diovan as she was not taking that medication outpatient.  She will need to be on Aspirin 81mg  po daily and Plavix 75mg  po daily for 3months then one therapy alone per neurology.  PT/OT recommend rolling walker,  Home health PT/OT, and intermittent supervision.  She will need follow up with Neurology outpatient who will call the patient with an appointment.  We appreciated their recommendations this admission.  She will follow with the Internal Medicine Clinic at least once at discharge and ten patient to decide a PCP to follow up she may establish with Dr. Sherrine Maples.  Emphasized medication compliance.   2. Hypertension, chronic and uncontrolled associated with medication noncompliance.   Noncompliant with Diovan outpatient prior to admission and she had not seen her primary care physician in [place] in approximately 4 years.  We initially alllowed for permissive hypertension in the setting of her stroke and intermittent TIA symptoms this admission.  She started HCTZ on 5/31ultimately titrated up to 25 mg before discharge. Blood pressure remained elevated so also started Amlodipine 5mg  and increased to 10 mg by discharge.  Blood Pressure intermittently uncontrolled with range 170s-190s/60s on day of discharge and this admission >160s-200/60s-90s.  She was instructed medication compliance and take medications as instructed outpatient.  She may need further blood pressure medications with goal systolic blood pressure 160 for now and ultimately possibly a little lower in the future.    3. Diarrhea, resolved  15 episodes of loose yellow diarrhea this admission which  resolved prior to discharge.  C. Difficile negative.  Her daughter had GI symptoms recently as well.  She had lightheadedness and weakness after multiple bowel movements which resolved prior to discharge after intravenous hydration.    4. Concern for urinary retention  -Bladder scan with 290 cc but patient has not voided, then 323 then 240. In and out catheter with 150 cc urine.  On day of discharge the bladder scan with approximately 40 cc urine and the patient voided better.  If this continues to be a problem she will need outpatient urology.    5. Hypokalemia, resolved.   DVT Prophylaxis with Lovenox, SCDs    Code status DNR/I.  Discussed code status with family at bedside patient  does not want cardiac resuscitation or intubation.    She will need outpatient colonoscopy and follow up mammogram with history of breast cancer and she has been lost to follow up.   Home Health ordered for home PT/OT services at discharge and case manager consulted. Family agreeable to assist patient at home.     Discharge Vitals:  BP 187/64  Pulse 73  Temp(Src) 98.1 F (36.7 C) (Oral)  Resp 16  Ht 5\' 6"  (1.676 m)  Wt 125 lb (56.7 kg)  BMI 20.19 kg/m2  SpO2 98%  Discharge physical exam: General: resting in bed, NAD, alert and oriented x3  HEENT: [place]/at,no scleral icterus  Cardiac: RRR, no rubs, murmurs or gallops  Pulm: clear to auscultation bilaterally, no wheezes, rales, or rhonchi  Abd: soft, nontender, nondistended, BS present  Ext: warm and well perfused, no pedal edema  Neuro: alert and oriented X3, CN 2-12 grossly intact, 5/5 strength upper and lower extremities b/l  Discharge Labs:  Results for Erin Branch, Erin Branch (MRN [id number]) as of 06/20/2012 13:48  Ref. Range 06/15/2012 04:05 06/16/2012 07:52 06/17/2012 05:22 06/18/2012 05:35 06/19/2012 07:55  Sodium Latest Range: 135-145 mEq/L 139 138 142 137 137  Potassium Latest Range: 3.5-5.1 mEq/L 3.2 (L) 3.7 3.6 3.7 4.0  Chloride Latest Range: 96-112 mEq/L  102 103 107 102 100  CO2 Latest Range: 19-32 mEq/L 28 27 25 26 24   BUN Latest Range: 6-23 mg/dL 15 15 16 13 19   Creatinine Latest Range: 0.50-1.10 mg/dL 2.95 2.84 1.32 4.40 1.02  Calcium Latest Range: 8.4-10.5 mg/dL 72.5 9.9 9.8 36.6 44.0  GFR calc non Af Amer Latest Range: >90 mL/min 66 (L) 68 (L) 79 (L) 69 (L) 58 (L)  GFR calc Af Amer Latest Range: >90 mL/min 77 (L) 79 (L) >90 80 (L) 67 (L)  Glucose Latest Range: 70-99 mg/dL 347 (H) 97 98 425 (H) 956 (H)  Magnesium Latest Range: 1.5-2.5 mg/dL   1.9  2.1  Alkaline Phosphatase Latest Range: 39-117 U/L 127 (H)      Albumin Latest Range: 3.5-5.2 g/dL 3.1 (L)      AST Latest Range: 0-37 U/L 15      ALT Latest Range: 0-35 U/L 9      Total Protein Latest Range: 6.0-8.3 g/dL 6.6      Total Bilirubin Latest Range: 0.3-1.2 mg/dL 0.5       Results for Erin Branch, Erin Branch (MRN [id number]) as of 06/20/2012 13:48  Ref. Range 06/13/2012 09:51 06/13/2012 10:45 06/14/2012 04:42  Sodium Latest Range: 135-145 mEq/L 141    Potassium Latest Range: 3.5-5.1 mEq/L 4.0    Chloride Latest Range: 96-112 mEq/L 107    Mean Plasma Glucose Latest Range: <117 mg/dL   951  BUN Latest Range: 6-23 mg/dL 13    Creatinine Latest Range: 0.50-1.10 mg/dL 8.84    Glucose Latest Range: 70-99 mg/dL 166 (H)    Calcium Ionized Latest Range: 1.13-1.30 mmol/L 1.28    Ammonia Latest Range: 11-60 umol/L  20   Cholesterol Latest Range: 0-200 mg/dL   063 (H)  Triglycerides Latest Range: <150 mg/dL   016 (H)  HDL Latest Range: >39 mg/dL   47  LDL (calc) Latest Range: 0-99 mg/dL   010 (H)  VLDL Latest Range: 0-40 mg/dL   38  Total CHOL/HDL Ratio No range found   5.3  Results for Erin Branch, Erin Branch (MRN [id number]) as of 06/20/2012 13:48  Ref. Range 06/13/2012 09:40 06/13/2012 09:49  Chloride Latest Range: 96-112 mEq/L 103  CO2 Latest Range: 19-32 mEq/L 24   BUN Latest Range: 6-23 mg/dL 13   Creatinine Latest Range: 0.50-1.10 mg/dL 1.61   Calcium Latest Range: 8.4-10.5 mg/dL 09.6   GFR calc non  Af Amer Latest Range: >90 mL/min 69 (L)   GFR calc Af Amer Latest Range: >90 mL/min 80 (L)   Glucose Latest Range: 70-99 mg/dL 045 (H)   Alkaline Phosphatase Latest Range: 39-117 U/L 137 (H)   Albumin Latest Range: 3.5-5.2 g/dL 3.2 (L)   AST Latest Range: 0-37 U/L 16   ALT Latest Range: 0-35 U/L 11   Total Protein Latest Range: 6.0-8.3 g/dL 6.9   Total Bilirubin Latest Range: 0.3-1.2 mg/dL 0.2 (L)   Troponin I Latest Range: <0.30 ng/mL <0.30   Troponin i, poc Latest Range: 0.00-0.08 ng/mL  0.00   Results for Erin Branch, Erin Branch (MRN [id number]) as of 06/20/2012 13:48  Ref. Range 06/13/2012 09:51 06/15/2012 04:05 06/19/2012 11:08  WBC Latest Range: 4.0-10.5 K/uL  5.8 7.7  RBC Latest Range: 3.87-5.11 MIL/uL  4.19 4.48  Hemoglobin Latest Range: 12.0-15.0 g/dL 78.2 (L) 95.6 (L) 21.3  HCT Latest Range: 36.0-46.0 % 34.0 (L) 34.6 (L) 37.2  MCV Latest Range: 78.0-100.0 fL  82.6 83.0  MCH Latest Range: 26.0-34.0 pg  27.0 27.2  MCHC Latest Range: 30.0-36.0 g/dL  08.6 57.8  RDW Latest Range: 11.5-15.5 %  13.3 13.9  Platelets Latest Range: 150-400 K/uL  273 277  Neutrophils Relative % Latest Range: 43-77 %   66  Lymphocytes Relative Latest Range: 12-46 %   26  Monocytes Relative Latest Range: 3-12 %   6  Eosinophils Relative Latest Range: 0-5 %   1  Basophils Relative Latest Range: 0-1 %   0  NEUT# Latest Range: 1.7-7.7 K/uL   5.1  Lymphocytes Absolute Latest Range: 0.7-4.0 K/uL   2.0  Monocytes Absolute Latest Range: 0.1-1.0 K/uL   0.5  Eosinophils Absolute Latest Range: 0.0-0.7 K/uL   0.1  Basophils Absolute Latest Range: 0.0-0.1 K/uL   0.0   Results for Erin Branch, Erin Branch (MRN [id number]) as of 06/20/2012 13:48  Ref. Range 06/13/2012 09:40  Prothrombin Time Latest Range: 11.6-15.2 seconds 13.4  INR Latest Range: 0.00-1.49  1.03  APTT Latest Range: 24-37 seconds 29   Results for Erin Branch, Erin Branch (MRN [id number]) as of 06/20/2012 13:48  Ref. Range 06/14/2012 04:42  Hemoglobin A1C Latest Range: <5.7 % 5.5    Results for Erin Branch, Erin Branch (MRN [id number]) as of 06/20/2012 13:48  Ref. Range 06/13/2012 09:25  Color, Urine Latest Range: YELLOW  YELLOW  APPearance Latest Range: CLEAR  CLOUDY (A)  Specific Gravity, Urine Latest Range: 1.005-1.030  1.009  pH Latest Range: 5.0-8.0  7.5  Glucose Latest Range: NEGATIVE mg/dL NEGATIVE  Bilirubin Urine Latest Range: NEGATIVE  NEGATIVE  Ketones, ur Latest Range: NEGATIVE mg/dL NEGATIVE  Protein Latest Range: NEGATIVE mg/dL NEGATIVE  Urobilinogen, UA Latest Range: 0.0-1.0 mg/dL 0.2  Nitrite Latest Range: NEGATIVE  NEGATIVE  Leukocytes, UA Latest Range: NEGATIVE  NEGATIVE  Hgb urine dipstick Latest Range: NEGATIVE  NEGATIVE   Results for Erin Branch, Erin Branch (MRN [id number]) as of 06/20/2012 13:48  Ref. Range 06/18/2012 18:30  C difficile by pcr Latest Range: NEGATIVE  NEGATIVE      Signed: Annett Gula 06/20/2012, 1:42 PM   Time Spent on Discharge: >45 minutes  Services Ordered on Discharge: H/H PT/Ot Equipment Ordered on Discharge: rolling walker

## 2012-06-20 NOTE — Progress Notes (Addendum)
PT Cancellation Note  Patient Details Name: Erin Branch MRN: [id number] DOB: 1933/08/10   Cancelled Treatment:    Reason Eval/Treat Not Completed: Other (comment) (pt's BP is high and she will be checked again in 15 mins. )  She doesn't want to do anything that she doesn't have to do that would elevate her BP before the RN checks it.  The BP levels per pt may determine if she discharges today or not.  PT to check back later as time allows.    Rollene Rotunda Lelar Farewell, PT, DPT [phone]   06/20/2012, 11:31 AM

## 2012-06-20 NOTE — Progress Notes (Signed)
Internal Medicine Attending  Date: 06/20/2012  Patient name: Erin Branch Medical record number: [id number] Date of birth: 1933/09/09 Age: 79 y.o. Gender: female  I saw and evaluated the patient. I reviewed the resident's note by Dr. Shirlee Latch and I agree with the resident's findings and plans as documented in her note, with the following additional comments.  Patient is doing well today and would like to go home.  She has had no further episodes of weakness while up with assistance.  Her postvoid residual today was 45 according to the nurse.  We discussed at length with patient and her family her need for physical therapy and fall precautions at home, as well as the need for close follow-up of her blood pressure which remains somewhat elevated but improved.  We also discussed the plans for her antiplatelet therapy for stroke prophylaxis.  I agree with the plan to discharge home today if she continues to do well.

## 2012-06-20 NOTE — Progress Notes (Signed)
Subjective: Patient denies lightheadedness or dizziness.  She is voiding better today.  Denies diarrhea.  She states she feels great but is concerned about her blood pressure being elevated  RN: bladder scan with 40 cc urine today  Objective: Vital signs in last 24 hours: Filed Vitals:   06/19/12 1800 06/19/12 2112 06/20/12 0131 06/20/12 0500  BP: 177/65 190/71 188/63 189/65  Pulse: 68 63 57 60  Temp: 98.1 F (36.7 C) 98.8 F (37.1 C) 98.1 F (36.7 C) 98 F (36.7 C)  TempSrc: Oral Oral Oral Oral  Resp: 18 16 18 16   Height:      Weight:      SpO2: 99% 99% 97% 98%   Weight change:   Intake/Output Summary (Last 24 hours) at 06/20/12 1108 Last data filed at 06/19/12 1800  Gross per 24 hour  Intake      0 ml  Output    713 ml  Net   -713 ml   Vitals reviewed. General: resting in bed, NAD, alert and oriented x3  HEENT: [place]/at,no scleral icterus Cardiac: RRR, no rubs, murmurs or gallops Pulm: clear to auscultation bilaterally, no wheezes, rales, or rhonchi Abd: soft, nontender, nondistended, BS present Ext: warm and well perfused, no pedal edema Neuro: alert and oriented X3, CN 2-12 grossly intact, 5/5 strength upper and lower extremities b/l  Lab Results: Basic Metabolic Panel:  Recent Labs Lab 06/17/12 0522 06/18/12 0535 06/19/12 0755  NA 142 137 137  K 3.6 3.7 4.0  CL 107 102 100  CO2 25 26 24   GLUCOSE 98 107* 141*  BUN 16 13 19   CREATININE 0.77 0.80 0.93  CALCIUM 9.8 10.4 10.3  MG 1.9  --  2.1   Liver Function Tests:  Recent Labs Lab 06/15/12 0405  AST 15  ALT 9  ALKPHOS 127*  BILITOT 0.5  PROT 6.6  ALBUMIN 3.1*   CBC:  Recent Labs Lab 06/15/12 0405 06/19/12 1108  WBC 5.8 7.7  NEUTROABS  --  5.1  HGB 11.3* 12.2  HCT 34.6* 37.2  MCV 82.6 83.0  PLT 273 277   CBG:  Recent Labs Lab 06/18/12 2330  GLUCAP 128*   Hemoglobin A1C:  Recent Labs Lab 06/14/12 0442  HGBA1C 5.5    Fasting Lipid Panel:  Recent Labs Lab 06/14/12 0442   CHOL 251*  HDL 47  LDLCALC 166*  TRIG 189*  CHOLHDL 5.3    Studies/Results: No results found. Medications: I have reviewed the patient's current medications. Scheduled Meds: . amLODipine  10 mg Oral Daily  . aspirin EC  81 mg Oral Daily  . atorvastatin  80 mg Oral q1800  . clopidogrel  75 mg Oral Q breakfast  . enoxaparin (LOVENOX) injection  40 mg Subcutaneous Q24H  . hydrochlorothiazide  25 mg Oral Daily  . sodium chloride  3 mL Intravenous Q12H   Continuous Infusions:  PRN Meds:.loperamide, sodium chloride  Assessment/Plan: 79yo F with PMH HTN, GERD, breast cancer s/p lumpectomy, presents to the ED with dizziness, dysarthria, and difficulty ambulating found to have acute infarctions.    1. Acute small infarcts in cerebellar hemispheres and left pons  -MRI with distal vertebral artery occluded b/l with very little flow in the basilar which is diffusely diseased accounting for acute b/l cerebellar and left pontine infarcts.  -Associated with dysarthria, balance issues, left gaze, left lower face asymmetry, mild left hand grip, left finger to nose abnormal.   All symptoms improved except for left finger to nose slightly  abnormal.   -BP has been an issue and she has been hypertensive.  We allowed for permissive HTN.  Now on HCTZ 25 and Norvasc 5 mg increased to 10 mg today. Will not resume Diovan as she was not taking that medication outpatient - Neuro checks q4h  - Vital signs q4h  - ASA 81mg  po daily and Plavix 75mg  po daily for 64mo, then one therapy alone per neuro.  - PT/OT-rec rolling walker. Home health PT/OT - F/u with Neurology outpatient, appreciate recommendations  -f/u with [facility] outpatient x 1 and then patient to decide a PCP to follow up she may establish with Dr. Sherrine Maples  2. HTN, chronic and uncontrolled -Noncompliant with Diovan outpatient prior to admission.   -Initially alllowed for permissive HTN in the setting of her CVA and TIAs. Started HCTZ on 5/31. BP  remained elevated so also started Amlodipine 5mg  and increased to 10 mg today -BP intermittently uncontrolled with range 170s-190s/60s recently  -will repeat BP after increased Norvasc  3. Diarrhea, resolved  4. Concern for urinary retention  -Bladder scan with 290 cc but patient has not voided, then 323 then 240.  This am bladder scan with 40 cc -voiding better  -if >250 cc call MD -if continues to be a problem will need outpatient urology  5. DVT PPx -Lovenox, SCDs  6. F/E/N -NSL -heart diet   7. Other -DNR/I:discussed code status with family at bedside patient does not want cardiac resuscitation or intubation -she will need outpatient colonoscopy and f/u mammogram with h/o breast cancer and she has been lost to follow up.  -H/H ordered for home PT/OT services at discharge and case manager consulted. Family agreeable to assist patient at home  Dispo: Disposition is deferred at this time, awaiting improvement of current medical problems.  Anticipated discharge later afternoon today  The patient does have a current PCP (SCHULTZ,DOUGLAS E, MD), therefore will be requiring OPC follow-up after discharge.   The patient does not have transportation limitations that hinder transportation to clinic appointments.  Services Needed at time of discharge: Y = Yes, Blank = No PT: Rolling walker, H/H PT  OT: H/H OT  RN:   Equipment: Rolling walker  Other:     LOS: 7 days   Annett Gula [phone] 06/20/2012, 11:08 AM

## 2012-06-22 ENCOUNTER — Telehealth: Payer: Self-pay | Admitting: *Deleted

## 2012-06-22 NOTE — Telephone Encounter (Signed)
Call from [place], Physical Therapist with [facility].- # [phone] He wants to see her twice a week for a few weeks. Pt has declined OT services. Hx: CVA  Will you give the VO for this?

## 2012-06-23 NOTE — Telephone Encounter (Signed)
Erin Branch has been given the Verbal okay for PT

## 2012-06-26 ENCOUNTER — Ambulatory Visit (HOSPITAL_BASED_OUTPATIENT_CLINIC_OR_DEPARTMENT_OTHER): Payer: Medicare Other | Admitting: Internal Medicine

## 2012-06-26 ENCOUNTER — Encounter: Payer: Self-pay | Admitting: Internal Medicine

## 2012-06-26 VITALS — BP 161/82 | HR 71 | Temp 97.4°F | Ht 64.0 in | Wt 139.1 lb

## 2012-06-26 DIAGNOSIS — E785 Hyperlipidemia, unspecified: Secondary | ICD-10-CM

## 2012-06-26 DIAGNOSIS — I10 Essential (primary) hypertension: Secondary | ICD-10-CM

## 2012-06-26 DIAGNOSIS — I6340 Cerebral infarction due to embolism of unspecified cerebral artery: Secondary | ICD-10-CM

## 2012-06-26 DIAGNOSIS — I639 Cerebral infarction, unspecified: Secondary | ICD-10-CM

## 2012-06-26 NOTE — Assessment & Plan Note (Addendum)
  BP Readings from Last 3 Encounters:  06/26/12 161/82  06/20/12 187/64    Lab Results  Component Value Date   Erin Branch 137 06/19/2012   K 4.0 06/19/2012   CREATININE 0.93 06/19/2012    Assessment: Blood pressure control:   Progress toward BP goal:    Comments:   Plan: Medications:  continue current medications Educational resources provided: brochure;handout;video Self management tools provided:   Other plans:   Patient has a long history of hypertension with medical noncompliance. She was discharged on HCTZ 25 and the amlodipine 10 for blood pressure control.  She brought her blood sugar log today, which showed blood pressure ranging at 140-170's/90-100's.  And she has 140's/90's this morning.   - I discussed with her about adding a third antihypertensive medication, such as lisinopril.  However, she's unable to followup with me since she lives in [place], [place].  She states that she prefers to followup with her primary care physician Erin Branch.  - I will leave it to Erin Branch decision of initiation of ACE inhibitor since she is unable to come back for BMP 2 weeks after ACE inhibitor initiation - Patient voices understanding and agrees with the plan.

## 2012-06-26 NOTE — Assessment & Plan Note (Signed)
Patient is here for hospital followup for evaluation of acute CVAs in the setting of uncontrolled hypertension secondary to medical noncompliance.  He She reports she is doing fine except occasional unsteady gait, with which she's been working with home health physical therapy. Denies numbness, tingling and weakness.  Reports reports medical compliance with her dual antiplatelet therapy.  - Continue the dual antiplatelet therapy for 3 months and then one therapy per neurology's recommendation.  The patient states that she had a followup appointment with Neurologist on 08/22/3012. - Continue home health PT/OT - Continue tight control of her hypertension and hyperlipidemia -Patient prefers to followup with her PCP Dr. Tomasa Blase.

## 2012-06-26 NOTE — Assessment & Plan Note (Signed)
Lipitor 80 initiated during this admission in June 2014.   - she will need FLP in 6 weeks  - will defer it to her PCP.

## 2012-06-26 NOTE — Progress Notes (Signed)
Subjective:   Patient ID: Erin Branch female   DOB: 1933/07/22 79 y.o.   MRN: [id number]  HPI: Erin Branch is a 79 y.o. woman with PMH of hypertension and medical noncompliance, GERD, breast cancer status post lumpectomy, and possible CHF, who presents to the clinic for hospital discharge followup.  Patient was admitted to the [facility] on 05/27- 06/20/2012 for evaluation of acute CVA.  She was noted to have acute small infarcts in the cerebellar hemispheres and the left pons and multiple TIAs during this admission.  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  She was discharged on aspirin 81 mg, Plavix 75 mg, and Lipitor 80 mg for secondary prevention.  She is recommended to continue the dural antiplatelet therapy for 3 months then one therapy per neurology recommendation.  She should followup with Neurologist as instructed.  With regards to her hypertension, she was started on hydrochlorothiazide 25 mg and Amlodipine 10 mg.  Her Diovan was discontinued upon discharge since she never took it.  She states that she is doing fine, and is here for followup.   Past Medical History  Diagnosis Date  . Hypertension   . Carcinoma of breast, stage 1 2006    s/p Right partial mastectomy   Current Outpatient Prescriptions  Medication Sig Dispense Refill  . amLODipine (NORVASC) 10 MG tablet Take 1 tablet (10 mg total) by mouth daily.  30 tablet  1  . aspirin 81 MG EC tablet Take 1 tablet (81 mg total) by mouth daily.  30 tablet  11  . atorvastatin (LIPITOR) 80 MG tablet Take 1 tablet (80 mg total) by mouth daily at 6 PM.  30 tablet  1  . clopidogrel (PLAVIX) 75 MG tablet Take 1 tablet (75 mg total) by mouth daily with breakfast.  90 tablet  0  . hydrochlorothiazide (HYDRODIURIL) 25 MG tablet Take 1 tablet (25 mg total) by mouth daily.  30  tablet  1   No current facility-administered medications for this visit.   Family History  Problem Relation Age of Onset  . Heart disease Mother    History   Social History  . Marital Status: Married    Spouse Name: N/A    Number of Children: N/A  . Years of Education: N/A   Social History Main Topics  . Smoking status: Never Smoker   . Smokeless tobacco: None  . Alcohol Use: No  . Drug Use: No  . Sexually Active: None   Other Topics Concern  . None   Social History Narrative  . None   Review of Systems: Review of Systems:  Constitutional:  Denies fever, chills, diaphoresis, appetite change and fatigue.   HEENT:  Denies congestion, sore throat, rhinorrhea, sneezing, mouth sores, trouble swallowing, neck pain   Respiratory:  Denies SOB, DOE, cough, and wheezing.   Cardiovascular:  Denies palpitations and leg swelling.   Gastrointestinal:  Denies nausea, vomiting, abdominal pain, diarrhea, constipation, blood in stool and abdominal distention.   Genitourinary:  Denies dysuria, urgency, frequency, hematuria, flank pain and difficulty urinating.   Musculoskeletal:  Denies myalgias, back pain, joint swelling, arthralgias and gait problem.   Skin:  Denies pallor, rash and wound.   Neurological:  Denies dizziness, seizures, syncope, weakness, light-headedness, numbness and headaches.    .    Objective:  Physical Exam: Filed Vitals:   06/26/12 1351  BP: 161/82  Pulse: 71  Temp: 97.4 F (36.3 C)  TempSrc: Oral  Height: 5\' 4"  (1.626 m)  Weight: 139 lb 1.6 oz (63.095 kg)  SpO2: 98%  General: alert, well-developed, and cooperative to examination.  Head: normocephalic and atraumatic.  Eyes: vision grossly intact, pupils equal, pupils round, pupils reactive to light, no injection and anicteric.  Mouth: pharynx pink and moist, no erythema, and no exudates.  Neck: supple, full ROM, no thyromegaly, no JVD, and no carotid bruits.  Lungs: normal respiratory effort, no  accessory muscle use, normal breath sounds, no crackles, and no wheezes. Heart: normal rate, regular rhythm, no murmur, no gallop, and no rub.  Abdomen: soft, non-tender, normal bowel sounds, no distention, no guarding, no rebound tenderness, no hepatomegaly, and no splenomegaly.  Msk: no joint swelling, no joint warmth, and no redness over joints.  Pulses: 2+ DP/PT pulses bilaterally Extremities: No cyanosis, clubbing, edema Neurologic: alert & oriented X3, cranial nerves II-XII intact, strength normal in all extremities, sensation intact to light touch, and gait normal. right finger to nose slightly abnormal   Skin: turgor normal and no rashes.  Psych: Oriented X3, memory intact for recent and remote, normally interactive, good eye contact, not anxious appearing, and not depressed appearing.     Assessment & Plan:   '

## 2012-06-26 NOTE — Patient Instructions (Addendum)
1. Please continue to take your current medications. 2. Please see your PCP within one week.  3. Please check your BP daily. You will need to discuss with your PCP about adding ACEI if your BP still high.  General Instructions:    Treatment Goals:  Goals (1 Years of Data) as of 06/26/12   None      Progress Toward Treatment Goals:    Self Care Goals & Plans:  Self Care Goal 06/26/2012  Manage my medications take my medicines as prescribed; bring my medications to every visit; refill my medications on time; follow the sick day instructions if I am sick  Monitor my health keep track of my blood pressure; keep track of my weight  Eat healthy foods eat more vegetables; eat fruit for snacks and desserts; eat foods that are low in salt; eat baked foods instead of fried foods; eat smaller portions  Be physically active take a walk every day; find an activity I enjoy       Care Management & Community Referrals:

## 2012-06-27 NOTE — Progress Notes (Signed)
Case discussed with Dr. Li immediately after the resident saw the patient.  We reviewed the resident's history and exam and pertinent patient test results.  I agree with the assessment, diagnosis and plan of care documented in the resident's note. 

## 2012-08-22 ENCOUNTER — Encounter: Payer: Self-pay | Admitting: Nurse Practitioner

## 2012-08-22 ENCOUNTER — Ambulatory Visit (INDEPENDENT_AMBULATORY_CARE_PROVIDER_SITE_OTHER): Payer: Medicare Other | Admitting: Nurse Practitioner

## 2012-08-22 VITALS — BP 155/74 | HR 79 | Temp 98.4°F | Ht 67.0 in | Wt 138.0 lb

## 2012-08-22 DIAGNOSIS — I651 Occlusion and stenosis of basilar artery: Secondary | ICD-10-CM | POA: Insufficient documentation

## 2012-08-22 DIAGNOSIS — I10 Essential (primary) hypertension: Secondary | ICD-10-CM

## 2012-08-22 DIAGNOSIS — E785 Hyperlipidemia, unspecified: Secondary | ICD-10-CM

## 2012-08-22 MED ORDER — CLOPIDOGREL BISULFATE 75 MG PO TABS: 75.0000 mg | ORAL_TABLET | Freq: Every day | ORAL | Status: DC

## 2012-08-22 NOTE — Progress Notes (Signed)
[facility]  PATIENT: Erin Branch DOB: [birth year]   HISTORY FROM: patient, chart REASON FOR VISIT: routine follow up  HISTORY OF PRESENT ILLNESS:  Erin Branch is an 79 y.o. female, right handed, with a past medical history significant for hypertension, remote breast cancer, brought to [facility] ED on 06/13/12 due to new onset unsteadiness, vertigo, impaired hearing, and left face droopiness and aphasia.  Never had similar symptoms before, but 3 days ago developed sudden onset vertigo and decreased hearing and when she woke up this morning she was severely off balance to the degree that needed help from her husband in order to walk and avoid falling.  Additionally, she was noted to have slurred speech and some droopiness of the left face.  Denies headache, double vision, difficulty swallowing, focal weakness or numbness, confusion.  Upon arrival to ED she had a CT brain that did not show acute intracranial abnormality.   UPDATE 08/22/12 (LL): Patient returns to office for 2 month stroke follow up visit.  She has had excellent recovery post-stroke.  Aphasia and decreased hearing resolved within hours, vertigo resolved in hospital.  Balance and coordination improved at home with therapy.  Patient still reports fatigue, minor incoordination, and her scalp feeling different.  She has been taking Plavix and aspirin 81 mg daily.  Patient denies medication side effects, with no signs of bleeding or excessive bruising.  Patient has completed 2 weeks of home PT/OT and was discharged.  No new neurological deficits.  Patient admits she was always very non-compliant with taking her medications in the past.  She states she is taking them every day as directed since stroke.  REVIEW OF SYSTEMS: Full 14 system review of systems performed and notable only for: constitutional: fatigue cardiovascular: N/A respiratory: N/A endocrine: feeling cold  ear/nose/throat: N/A  Hematology/Lymph: easy  bleeding, easy bruising musculoskeletal: N/A skin: N/A genitourinary: N/A Gastrointestinal: constipation allergy/immunology: N/A neurological: N/A sleep: N/A psychiatric: decreased energy   ALLERGIES: Allergies  Allergen Reactions  . Contrast Media (Iodinated Diagnostic Agents) Nausea And Vomiting  . Penicillins Itching and Swelling  . Sulfa Antibiotics     HOME MEDICATIONS: Outpatient Prescriptions Prior to Visit  Medication Sig Dispense Refill  . amLODipine (NORVASC) 10 MG tablet Take 1 tablet (10 mg total) by mouth daily.  30 tablet  1  . atorvastatin (LIPITOR) 80 MG tablet Take 1 tablet (80 mg total) by mouth daily at 6 PM.  30 tablet  1  . hydrochlorothiazide (HYDRODIURIL) 25 MG tablet Take 1 tablet (25 mg total) by mouth daily.  30 tablet  1  . aspirin 81 MG EC tablet Take 1 tablet (81 mg total) by mouth daily.  30 tablet  11  . clopidogrel (PLAVIX) 75 MG tablet Take 1 tablet (75 mg total) by mouth daily with breakfast.  90 tablet  0   No facility-administered medications prior to visit.    PAST MEDICAL HISTORY: Past Medical History  Diagnosis Date  . Hypertension   . Carcinoma of breast, stage 1 2006    s/p Right partial mastectomy    PAST SURGICAL HISTORY: Past Surgical History  Procedure Laterality Date  . Partial mastectomy Right 04/2004    Rose Phi. Young, M.D.   . Breast surgery      FAMILY HISTORY: Family History  Problem Relation Age of Onset  . Heart disease Mother   . Throat cancer Father     SOCIAL HISTORY: History   Social History  . Marital  Status: Married    Spouse Name: Fayrene Fearing    Number of Children: 5  . Years of Education: 12th   Occupational History  .      n/a   Social History Main Topics  . Smoking status: Never Smoker   . Smokeless tobacco: Never Used  . Alcohol Use: No  . Drug Use: No  . Sexually Active: Not on file   Other Topics Concern  . Not on file   Social History Narrative   Patient lives at home with spouse.     Caffeine Use:  None; rarely 1 cup day     PHYSICAL EXAM  Filed Vitals:   08/22/12 1536  BP: 155/74  Pulse: 79  Temp: 98.4 F (36.9 C)  TempSrc: Oral  Height: 5\' 7"  (1.702 m)  Weight: 138 lb (62.596 kg)   Body mass index is 21.61 kg/(m^2).  Generalized: In no acute distress, pleasant elderly Caucasian female.   Neck: Supple, no carotid bruits   Cardiac: Regular rate rhythm, no murmur   Pulmonary: Clear to auscultation bilaterally   Musculoskeletal: No deformity   Neurological examination   Mentation: Alert oriented to time, place, history taking, language fluent, and casual conversation  Cranial nerve II-XII: Pupils were equal round reactive to light extraocular movements were full, visual field were full on confrontational test. facial sensation and strength were normal. hearing was intact to finger rubbing bilaterally. Uvula tongue midline. head turning and shoulder shrug and were normal and symmetric. MOTOR: normal bulk and tone, full strength in the BUE, BLE, Mild diminished fine finger movements on left. Orbits right over left upper extremity. Mild left grip weak.  No pronator drift. SENSORY: normal and symmetric to light touch, pinprick, and temperature COORDINATION: finger-nose-finger normal, heel-to-shin bilaterally normal, there was no truncal ataxia REFLEXES: Equal and symmetric GAIT/STATION: Rising up from seated position without assistance, normal stance, without trunk ataxia, moderate stride, good arm swing, smooth turning, able to perform tiptoe, heel walking and tandem without difficulty. Romberg negative.   DIAGNOSTIC DATA (LABS, IMAGING, TESTING) - I reviewed patient records, labs, notes, testing and imaging myself where available.  Lab Results  Component Value Date   WBC 7.7 06/19/2012   HGB 12.2 06/19/2012   HCT 37.2 06/19/2012   MCV 83.0 06/19/2012   PLT 277 06/19/2012      Component Value Date/Time   NA 137 06/19/2012 0755   K 4.0 06/19/2012 0755   CL  100 06/19/2012 0755   CO2 24 06/19/2012 0755   GLUCOSE 141* 06/19/2012 0755   BUN 19 06/19/2012 0755   CREATININE 0.93 06/19/2012 0755   CALCIUM 10.3 06/19/2012 0755   PROT 6.6 06/15/2012 0405   ALBUMIN 3.1* 06/15/2012 0405   AST 15 06/15/2012 0405   ALT 9 06/15/2012 0405   ALKPHOS 127* 06/15/2012 0405   BILITOT 0.5 06/15/2012 0405   GFRNONAA 58* 06/19/2012 0755   GFRAA 67* 06/19/2012 0755   Lab Results  Component Value Date   CHOL 251* 06/14/2012   HDL 47 06/14/2012   LDLCALC 166* 06/14/2012   TRIG 189* 06/14/2012   CHOLHDL 5.3 06/14/2012   Lab Results  Component Value Date   HGBA1C 5.5 06/14/2012   No results found for this basename: VITAMINB12   No results found for this basename: TSH   CT of the brain  06/13/2012 Scattered small acute infarcts noted within both cerebellar hemispheres and the left pons on recent MRI. The appearance is most compatible with an embolic event. The  cerebellar infarcts are slightly better characterized on the current study; the left pontine infarct is only minimally visualized on CT. No evidence of hemorrhagic transformation.  06/13/2012 No evidence of acute intracranial abnormality.  MRI of the brain 06/13/2012 Note that the impression for the MRI of the brain should read: Small acute infarcts in the cerebellum bilaterally; small acute infarct in the left pons.  MRA of the brain 06/13/2012 The distal vertebral artery is occluded bilaterally. There is very little flow in the basilar which is diffusely diseased, accounting for acute bilateral cerebellar and left pontine infarcts  2D Echocardiogram EF 60-65% with no source of embolus. No significant valvular abnormalities  Carotid Doppler No evidence of hemodynamically significant internal carotid artery stenosis. Vertebral artery flow is antegrade.  CXR 06/13/2012 Left basilar scarring or atelectasis. Mild hyperinflation.  EKG normal sinus rhythm.   ASSESSMENT AND PLAN Mrs. 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  Vertigo, aphasia, and decreased hearing have resolved. Very mild residual incoordination deficits.     Continue clopidogrel 75 mg orally every day  for secondary stroke prevention and maintain strict control of hypertension with blood pressure goal between 120-140 systolic, and lipids with LDL cholesterol goal below 100 mg/dL.   Patient was advised to stay hydrated at all times due to stenosed/hypoplastic posterior circulation.  Drink 8 glasses of water daily. Lipid panel to be checked every 6 months. Followup in 4 months.  Damilola Flamm NP-C 08/22/2012, 4:41 PM  [facility] [address] [place], [place] [address] [phone]  I have personally examined this patient, reviewed pertinent data, developed plan of care and discussed with patient and agree with above.  Delia Heady, MD

## 2012-08-22 NOTE — Patient Instructions (Addendum)
Continue clopidogrel 75 mg orally every day  for secondary stroke prevention and maintain strict control of hypertension with blood pressure goal below 130/90, diabetes with hemoglobin A1c goal below 6.5% and lipids with LDL cholesterol goal below 100 mg/dL.   Followup in the future with me in 4 months.  STROKE/TIA INSTRUCTIONS SMOKING Cigarette smoking nearly doubles your risk of having a stroke & is the single most alterable risk factor  If you smoke or have smoked in the last 12 months, you are advised to quit smoking for your health.  Most of the excess cardiovascular risk related to smoking disappears within a year of stopping.  Ask you doctor about anti-smoking medications  [facility] Quit Line: 1-800-QUIT NOW  Free Smoking Cessation Classes ([phone]  CHOLESTEROL Know your levels; limit fat & cholesterol in your diet  Lab Results  Component Value Date   CHOL 251* 06/14/2012   HDL 47 06/14/2012   LDLCALC 166* 06/14/2012   TRIG 189* 06/14/2012   CHOLHDL 5.3 06/14/2012      Many patients benefit from treatment even if their cholesterol is at goal.  Goal: Total Cholesterol less than 160  Goal:  LDL less than 100  Goal:  HDL greater than 40  Goal:  Triglycerides less than 150  BLOOD PRESSURE American Stroke Association blood pressure target is less that 120/80 mm/Hg  Your discharge blood pressure is:  BP: 155/74 mmHg  Monitor your blood pressure  Limit your salt and alcohol intake  Many individuals will require more than one medication for high blood pressure  DIABETES (A1c is a blood sugar average for last 3 months) Goal A1c is under 7% (A1c is blood sugar average for last 3 months)  Diabetes: No known diagnosis of diabetes    Lab Results  Component Value Date   HGBA1C 5.5 06/14/2012    Your A1c can be lowered with medications, healthy diet, and exercise.  Check your blood sugar as directed by your physician  Call your physician if you experience unexplained or low blood  sugars.  PHYSICAL ACTIVITY/REHABILITATION Goal is 30 minutes at least 4 days per week    Activity decreases your risk of heart attack and stroke and makes your heart stronger.  It helps control your weight and blood pressure; helps you relax and can improve your mood.  Participate in a regular exercise program.  Talk with your doctor about the best form of exercise for you (dancing, walking, swimming, cycling).  DIET/WEIGHT Goal is to maintain a healthy weight  Your height is:  Height: 5\' 7"  (170.2 cm) Your current weight is: Weight: 138 lb (62.596 kg) Your body Mass Index (BMI) is:  BMI (Calculated): 21.7  Following the type of diet specifically designed for you will help prevent another stroke.  Your goal Body Mass Index (BMI) is 19-24.  Healthy food habits can help reduce 3 risk factors for stroke:  High cholesterol, hypertension, and excess weight.

## 2012-08-23 ENCOUNTER — Other Ambulatory Visit: Payer: Self-pay

## 2012-09-04 ENCOUNTER — Telehealth: Payer: Self-pay | Admitting: Internal Medicine

## 2012-09-04 ENCOUNTER — Telehealth: Payer: Self-pay | Admitting: Nurse Practitioner

## 2012-09-04 NOTE — Telephone Encounter (Signed)
  INTERNAL MEDICINE RESIDENCY PROGRAM After-Hours Telephone Call    Reason for call:   I placed an outgoing call to [name] at 8 pm since she pages me earlier.  Patient states that her PCP Dr. Hoy Branch office nurse called her and told her that her Hemoglobin was ? 10.9, and she was instructed on iron rich diet. She states that she is not satisfied with iron rich diet. She would like me to prescribe her some oral iron supplement since she was admitted to our service in June 2014.  Patient states that she is feeling fine. No SOB, chest pain or chest pressure. She reports chronic tiredness but nothing acute today.       Assessment/ Plan:   I instructed her that she should contact her PCP Dr. Hoy Branch office to discuss about the needs for oral iron supplement. And we will not be able to prescribe the medication since she does not follow up with the clinic. Patient agrees with the plan and states that she will call her PCP in am.   As always, pt is advised that if symptoms worsen or new symptoms arise, they should go to an urgent care facility or to to ER for further evaluation.    Erin Query, MD   09/04/2012, 8:28 PM

## 2012-09-04 NOTE — Telephone Encounter (Signed)
Patient had lab results faxed to our office and wanted to discuss them.  Completed contact and answered questions regarding mild anemia and lipid panel much improved.

## 2012-11-21 NOTE — Addendum Note (Signed)
Addended by: Bufford Spikes on: 11/21/2012 09:59 AM   Modules accepted: Orders

## 2012-11-23 ENCOUNTER — Other Ambulatory Visit: Payer: Self-pay

## 2012-12-22 ENCOUNTER — Encounter: Payer: Self-pay | Admitting: Nurse Practitioner

## 2012-12-22 ENCOUNTER — Ambulatory Visit (INDEPENDENT_AMBULATORY_CARE_PROVIDER_SITE_OTHER): Payer: Medicare Other | Admitting: Nurse Practitioner

## 2012-12-22 VITALS — BP 181/90 | HR 84 | Ht 63.75 in | Wt 130.0 lb

## 2012-12-22 DIAGNOSIS — R269 Unspecified abnormalities of gait and mobility: Secondary | ICD-10-CM

## 2012-12-22 DIAGNOSIS — I651 Occlusion and stenosis of basilar artery: Secondary | ICD-10-CM

## 2012-12-22 NOTE — Patient Instructions (Addendum)
  Continue aspirin orally every day for secondary stroke prevention and maintain strict control of hypertension with blood pressure goal between 120-140 systolic, and lipids with LDL cholesterol goal below 100 mg/dL.  Patient was advised to stay hydrated at all times due to stenosed/hypoplastic posterior circulation. Drink 8 glasses of water daily.  Lipid panel to be checked every 6 months at your Primary Care. I have ordered Home Health for Physical Therapy to do Gait and balance training.  Someone to contact you within 2 weeks.  If no one calls, please contact our office. Followup in 6 months.

## 2012-12-22 NOTE — Progress Notes (Signed)
PATIENT: Erin Branch DOB: 01/06/1934   REASON FOR VISIT: stroke follow up HISTORY FROM: patient  HISTORY OF PRESENT ILLNESS: Erin Branch is an 79 y.o. female, right handed, with a past medical history significant for hypertension, remote breast cancer, brought to [facility] ED on 06/13/12 due to new onset unsteadiness, vertigo, impaired hearing, and left face droopiness and aphasia. Never had similar symptoms before, but 3 days ago developed sudden onset vertigo and decreased hearing and when she woke up this morning she was severely off balance to the degree that needed help from her husband in order to walk and avoid falling. Additionally, she was noted to have slurred speech and some droopiness of the left face.  Denies headache, double vision, difficulty swallowing, focal weakness or numbness, confusion.  Upon arrival to ED she had a CT brain that did not show acute intracranial abnormality.   UPDATE 08/22/12 (LL): Patient returns to office for 2 month stroke follow up visit. She has had excellent recovery post-stroke. Aphasia and decreased hearing resolved within hours, vertigo resolved in hospital. Balance and coordination improved at home with therapy. Patient still reports fatigue, minor incoordination, and her scalp feeling different. She has been taking Plavix and aspirin 81 mg daily. Patient denies medication side effects, with no signs of bleeding or excessive bruising. Patient has completed 2 weeks of home PT/OT and was discharged. No new neurological deficits. Patient admits she was always very non-compliant with taking her medications in the past. She states she is taking them every day as directed since stroke.   UPDATE 12/22/12 (LL):  Patient returns to office for 6 month stroke follow up.  Since last visit, she has developed worsening back pain, has seen orthopedics who told her she had arthritic changes in her back.  She has developed a limping gait as well, is using a cane for  balance.  She stopped taking atorvastatin and plavix due to all-over myalgias and is taking 3 baby aspirin daily instead.  She stopped the atorvastatin first and still did not feel better until she had stopped the plavix 2 weeks later.  She does not want to try another statin.  REVIEW OF SYSTEMS: Full 14 system review of systems performed and notable only for:  respiratory: cough, shortness of breath ear/nose/throat: runny nose  musculoskeletal: joint pain, back pain, aching muscles Gastrointestinal: nausea, vomiting psychiatric: agitation  ALLERGIES: Allergies  Allergen Reactions  . Contrast Media [Iodinated Diagnostic Agents] Nausea And Vomiting  . Penicillins Itching and Swelling  . Sulfa Antibiotics     HOME MEDICATIONS: Outpatient Prescriptions Prior to Visit  Medication Sig Dispense Refill  . hydrochlorothiazide (HYDRODIURIL) 25 MG tablet Take 1 tablet (25 mg total) by mouth daily.  30 tablet  1   Meds ordered this encounter  Medications  . valsartan (DIOVAN) 160 MG tablet    Sig: Take 160 mg by mouth daily.  Marland Kitchen aspirin 81 MG tablet    Sig: Take 81 mg by mouth daily.  . Ferrous Sulfate 50 MG TBCR    Sig: Take 50 mg by mouth daily.   PAST MEDICAL HISTORY: Past Medical History  Diagnosis Date  . Hypertension   . Carcinoma of breast, stage 1 2006    s/p Right partial mastectomy  . Stroke 06/13/12    Bilateral cerebellum; also left pons    PAST SURGICAL HISTORY: Past Surgical History  Procedure Laterality Date  . Partial mastectomy Right 04/2004    Rose Phi. Maple Hudson, M.D.   .  Breast surgery      FAMILY HISTORY: Family History  Problem Relation Age of Onset  . Heart disease Mother   . Throat cancer Father     SOCIAL HISTORY: History   Social History  . Marital Status: Married    Spouse Name: Fayrene Fearing    Number of Children: 5  . Years of Education: 12th   Occupational History  . retired     n/a   Social History Main Topics  . Smoking status: Never Smoker     . Smokeless tobacco: Never Used  . Alcohol Use: No  . Drug Use: No  . Sexual Activity: No   Other Topics Concern  . Not on file   Social History Narrative   Patient lives at home with spouse.    Caffeine Use:  None; rarely 1 cup day     PHYSICAL EXAM  Filed Vitals:   12/22/12 1507  BP: 181/90  Pulse: 84  Height: 5' 3.75" (1.619 m)  Weight: 130 lb (58.968 kg)   Body mass index is 22.5 kg/(m^2).  Generalized: In no acute distress, pleasant elderly Caucasian female.  Neck: Supple, no carotid bruits  Cardiac: Regular rate rhythm, no murmur  Pulmonary: Clear to auscultation bilaterally  Musculoskeletal: No deformity   Neurological examination  Mentation: Alert oriented to time, place, history taking, language fluent, and casual conversation  Cranial nerve II-XII: Pupils were equal round reactive to light extraocular movements were full, visual field were full on confrontational test. facial sensation and strength were normal. hearing was intact to finger rubbing bilaterally. Uvula tongue midline. head turning and shoulder shrug and were normal and symmetric.  MOTOR: normal bulk and tone, full strength in the BUE, BLE, Mild diminished fine finger movements on left. Orbits right over left upper extremity. Mild left grip weak. No pronator drift.  SENSORY: normal and symmetric to light touch, pinprick, and temperature  COORDINATION: finger-nose-finger normal, heel-to-shin bilaterally normal, there was no truncal ataxia  REFLEXES: Equal and symmetric  GAIT/STATION: Rising up from seated position without assistance, SLOW ARTHRALGIC GAIT. unable to perform tiptoe, heel walking and tandem without difficulty. Romberg negative.   DIAGNOSTIC DATA (LABS, IMAGING, TESTING) - I reviewed patient records, labs, notes, testing and imaging myself where available.  Lab Results  Component Value Date   WBC 7.7 06/19/2012   HGB 12.2 06/19/2012   HCT 37.2 06/19/2012   MCV 83.0 06/19/2012   PLT 277  06/19/2012      Component Value Date/Time   NA 137 06/19/2012 0755   K 4.0 06/19/2012 0755   CL 100 06/19/2012 0755   CO2 24 06/19/2012 0755   GLUCOSE 141* 06/19/2012 0755   BUN 19 06/19/2012 0755   CREATININE 0.93 06/19/2012 0755   CALCIUM 10.3 06/19/2012 0755   PROT 6.6 06/15/2012 0405   ALBUMIN 3.1* 06/15/2012 0405   AST 15 06/15/2012 0405   ALT 9 06/15/2012 0405   ALKPHOS 127* 06/15/2012 0405   BILITOT 0.5 06/15/2012 0405   GFRNONAA 58* 06/19/2012 0755   GFRAA 67* 06/19/2012 0755   Lab Results  Component Value Date   CHOL 251* 06/14/2012   HDL 47 06/14/2012   LDLCALC 166* 06/14/2012   TRIG 189* 06/14/2012   CHOLHDL 5.3 06/14/2012   Lab Results  Component Value Date   HGBA1C 5.5 06/14/2012     ASSESSMENT AND PLAN Erin Branch is a right-handed Caucasian female who 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  Vertigo, aphasia, and decreased hearing have resolved. Since last visit she has developed a gait problem, favors left leg.  Continue aspirin orally every day for secondary stroke prevention and maintain strict control of hypertension with blood pressure goal between 120-140 systolic, and lipids with LDL cholesterol goal below 100 mg/dL.  Patient was advised to stay hydrated at all times due to stenosed/hypoplastic posterior circulation. Drink 8 glasses of water daily.  Lipid panel to be checked every 6 months at your Primary Care. Order [facility] for PT gait and balance training. Followup in 6 months, must see Dr. Pearlean Brownie next time.  Orders Placed This Encounter  Procedures  . Home Health  . Face-to-face encounter (required for Medicare/Medicaid patients)   Return in about 6 months (around 06/22/2013).  Ronal Fear, MSN, NP-C 12/22/2012, 4:26 PM [facility] [address], [address] [place], [place] [address] [phone]  Note: This document was prepared with digital dictation and possible smart phrase  technology. Any transcriptional errors that result from this process are unintentional.

## 2013-01-16 ENCOUNTER — Telehealth: Payer: Self-pay | Admitting: Nurse Practitioner

## 2013-01-16 NOTE — Telephone Encounter (Signed)
Home health referral was put in as an order.  Sandy Poteat does not get orders in her workqueue so the order was not sent.  It has to be put in as an ambulatory referral to [facility].

## 2013-01-16 NOTE — Telephone Encounter (Signed)
Please re-enter order. See Erin Branch

## 2013-01-16 NOTE — Telephone Encounter (Signed)
Patient's daughter called regarding home health care orders for [place] that was discussed at the 12/22/2012 visit. She would like a call back to discuss this.

## 2013-01-20 ENCOUNTER — Other Ambulatory Visit: Payer: Self-pay | Admitting: Nurse Practitioner

## 2013-01-20 DIAGNOSIS — R269 Unspecified abnormalities of gait and mobility: Secondary | ICD-10-CM

## 2013-01-20 DIAGNOSIS — I6350 Cerebral infarction due to unspecified occlusion or stenosis of unspecified cerebral artery: Secondary | ICD-10-CM

## 2013-01-20 NOTE — Telephone Encounter (Signed)
I entered a referral.  Sorry for the delay, I was out of town.

## 2013-01-25 ENCOUNTER — Encounter (HOSPITAL_COMMUNITY): Payer: Self-pay | Admitting: Emergency Medicine

## 2013-01-25 ENCOUNTER — Observation Stay (HOSPITAL_COMMUNITY)
Admission: EM | Admit: 2013-01-25 | Discharge: 2013-01-28 | Disposition: A | Discharge status: Home Health | Payer: Medicare Other | Attending: Internal Medicine | Admitting: Internal Medicine

## 2013-01-25 ENCOUNTER — Emergency Department (HOSPITAL_COMMUNITY): Payer: Medicare Other

## 2013-01-25 ENCOUNTER — Observation Stay (HOSPITAL_COMMUNITY): Payer: Medicare Other

## 2013-01-25 DIAGNOSIS — K219 Gastro-esophageal reflux disease without esophagitis: Secondary | ICD-10-CM | POA: Insufficient documentation

## 2013-01-25 DIAGNOSIS — I10 Essential (primary) hypertension: Secondary | ICD-10-CM | POA: Insufficient documentation

## 2013-01-25 DIAGNOSIS — Z7982 Long term (current) use of aspirin: Secondary | ICD-10-CM | POA: Insufficient documentation

## 2013-01-25 DIAGNOSIS — R9431 Abnormal electrocardiogram [ECG] [EKG]: Secondary | ICD-10-CM | POA: Insufficient documentation

## 2013-01-25 DIAGNOSIS — C7952 Secondary malignant neoplasm of bone marrow: Principal | ICD-10-CM

## 2013-01-25 DIAGNOSIS — C50919 Malignant neoplasm of unspecified site of unspecified female breast: Secondary | ICD-10-CM | POA: Insufficient documentation

## 2013-01-25 DIAGNOSIS — M549 Dorsalgia, unspecified: Secondary | ICD-10-CM | POA: Diagnosis present

## 2013-01-25 DIAGNOSIS — Z8673 Personal history of transient ischemic attack (TIA), and cerebral infarction without residual deficits: Secondary | ICD-10-CM | POA: Insufficient documentation

## 2013-01-25 DIAGNOSIS — F411 Generalized anxiety disorder: Secondary | ICD-10-CM | POA: Insufficient documentation

## 2013-01-25 DIAGNOSIS — Z7689 Persons encountering health services in other specified circumstances: Secondary | ICD-10-CM

## 2013-01-25 DIAGNOSIS — C7951 Secondary malignant neoplasm of bone: Principal | ICD-10-CM | POA: Insufficient documentation

## 2013-01-25 DIAGNOSIS — E785 Hyperlipidemia, unspecified: Secondary | ICD-10-CM | POA: Insufficient documentation

## 2013-01-25 DIAGNOSIS — R32 Unspecified urinary incontinence: Secondary | ICD-10-CM | POA: Insufficient documentation

## 2013-01-25 DIAGNOSIS — Z853 Personal history of malignant neoplasm of breast: Secondary | ICD-10-CM | POA: Insufficient documentation

## 2013-01-25 DIAGNOSIS — Z01818 Encounter for other preprocedural examination: Secondary | ICD-10-CM

## 2013-01-25 DIAGNOSIS — R112 Nausea with vomiting, unspecified: Secondary | ICD-10-CM | POA: Insufficient documentation

## 2013-01-25 DIAGNOSIS — Z9010 Acquired absence of unspecified breast and nipple: Secondary | ICD-10-CM | POA: Insufficient documentation

## 2013-01-25 HISTORY — DX: Gastro-esophageal reflux disease without esophagitis: K21.9

## 2013-01-25 HISTORY — DX: Unspecified osteoarthritis, unspecified site: M19.90

## 2013-01-25 HISTORY — DX: Anxiety disorder, unspecified: F41.9

## 2013-01-25 LAB — URINALYSIS, ROUTINE W REFLEX MICROSCOPIC
BILIRUBIN URINE: NEGATIVE
GLUCOSE, UA: NEGATIVE mg/dL
Hgb urine dipstick: NEGATIVE
KETONES UR: NEGATIVE mg/dL
Leukocytes, UA: NEGATIVE
Nitrite: NEGATIVE
PH: 7.5 (ref 5.0–8.0)
PROTEIN: NEGATIVE mg/dL
Specific Gravity, Urine: 1.006 (ref 1.005–1.030)
Urobilinogen, UA: 0.2 mg/dL (ref 0.0–1.0)

## 2013-01-25 LAB — SAVE SMEAR

## 2013-01-25 LAB — CBC WITH DIFFERENTIAL/PLATELET
BASOS PCT: 0 % (ref 0–1)
Basophils Absolute: 0 10*3/uL (ref 0.0–0.1)
EOS ABS: 0.1 10*3/uL (ref 0.0–0.7)
Eosinophils Relative: 1 % (ref 0–5)
HEMATOCRIT: 29.4 % — AB (ref 36.0–46.0)
Hemoglobin: 9.5 g/dL — ABNORMAL LOW (ref 12.0–15.0)
LYMPHS PCT: 33 % (ref 12–46)
Lymphs Abs: 2.8 10*3/uL (ref 0.7–4.0)
MCH: 26.3 pg (ref 26.0–34.0)
MCHC: 32.3 g/dL (ref 30.0–36.0)
MCV: 81.4 fL (ref 78.0–100.0)
MONO ABS: 0.4 10*3/uL (ref 0.1–1.0)
Monocytes Relative: 5 % (ref 3–12)
Neutro Abs: 5.1 10*3/uL (ref 1.7–7.7)
Neutrophils Relative %: 61 % (ref 43–77)
Platelets: 308 10*3/uL (ref 150–400)
RBC: 3.61 MIL/uL — ABNORMAL LOW (ref 3.87–5.11)
RDW: 15.4 % (ref 11.5–15.5)
WBC: 8.4 10*3/uL (ref 4.0–10.5)

## 2013-01-25 LAB — LIPASE, BLOOD: Lipase: 39 U/L (ref 11–59)

## 2013-01-25 LAB — COMPREHENSIVE METABOLIC PANEL
ALT: 11 U/L (ref 0–35)
AST: 24 U/L (ref 0–37)
Albumin: 3.2 g/dL — ABNORMAL LOW (ref 3.5–5.2)
Alkaline Phosphatase: 137 U/L — ABNORMAL HIGH (ref 39–117)
BILIRUBIN TOTAL: 0.4 mg/dL (ref 0.3–1.2)
BUN: 15 mg/dL (ref 6–23)
CALCIUM: 11.4 mg/dL — AB (ref 8.4–10.5)
CHLORIDE: 102 meq/L (ref 96–112)
CO2: 27 meq/L (ref 19–32)
CREATININE: 1 mg/dL (ref 0.50–1.10)
GFR, EST AFRICAN AMERICAN: 61 mL/min — AB (ref >90)
GFR, EST NON AFRICAN AMERICAN: 53 mL/min — AB (ref >90)
GLUCOSE: 95 mg/dL (ref 70–99)
Potassium: 3.9 mEq/L (ref 3.7–5.3)
SODIUM: 142 meq/L (ref 137–147)
Total Protein: 7.2 g/dL (ref 6.0–8.3)

## 2013-01-25 LAB — TROPONIN I
Troponin I: <0.3 ng/mL (ref <0.30)
Troponin I: <0.3 ng/mL (ref <0.30)

## 2013-01-25 LAB — PROTIME-INR
INR: 1.05 (ref 0.00–1.49)
Prothrombin Time: 13.5 seconds (ref 11.6–15.2)

## 2013-01-25 LAB — LACTATE DEHYDROGENASE: LDH: 195 U/L (ref 94–250)

## 2013-01-25 MED ORDER — SODIUM CHLORIDE 0.9 % IV SOLN
INTRAVENOUS | Status: DC
  Administered 2013-01-25: 17:00:00 via INTRAVENOUS

## 2013-01-25 MED ORDER — HEPARIN SODIUM (PORCINE) 5000 UNIT/ML IJ SOLN
5000.0000 [IU] | Freq: Three times a day (TID) | INTRAMUSCULAR | Status: DC
  Administered 2013-01-25 – 2013-01-27 (×6): 5000 [IU] via SUBCUTANEOUS
  Filled 2013-01-25 (×12): qty 1

## 2013-01-25 MED ORDER — MORPHINE SULFATE 2 MG/ML IJ SOLN
1.0000 mg | INTRAMUSCULAR | Status: DC | PRN
  Administered 2013-01-27: 1 mg via INTRAVENOUS
  Filled 2013-01-25: qty 1

## 2013-01-25 MED ORDER — ONDANSETRON HCL 4 MG/2ML IJ SOLN
4.0000 mg | Freq: Once | INTRAMUSCULAR | Status: AC
  Administered 2013-01-25: 4 mg via INTRAVENOUS
  Filled 2013-01-25: qty 2

## 2013-01-25 MED ORDER — ONDANSETRON HCL 4 MG PO TABS
4.0000 mg | ORAL_TABLET | Freq: Four times a day (QID) | ORAL | Status: DC | PRN
  Administered 2013-01-28: 4 mg via ORAL
  Filled 2013-01-25: qty 1

## 2013-01-25 MED ORDER — OXYCODONE HCL ER 15 MG PO T12A
15.0000 mg | EXTENDED_RELEASE_TABLET | Freq: Two times a day (BID) | ORAL | Status: DC
  Administered 2013-01-25 – 2013-01-28 (×6): 15 mg via ORAL
  Filled 2013-01-25 (×6): qty 1

## 2013-01-25 MED ORDER — SODIUM CHLORIDE 0.9 % IV SOLN: 250.0000 mL | INTRAVENOUS | Status: DC | PRN

## 2013-01-25 MED ORDER — SODIUM CHLORIDE 0.9 % IJ SOLN
3.0000 mL | Freq: Two times a day (BID) | INTRAMUSCULAR | Status: DC
Start: 2013-01-25 — End: 2013-01-28
  Administered 2013-01-26 – 2013-01-27 (×3): 3 mL via INTRAVENOUS

## 2013-01-25 MED ORDER — OXYCODONE HCL 5 MG PO TABS
5.0000 mg | ORAL_TABLET | ORAL | Status: DC | PRN
  Administered 2013-01-26 – 2013-01-27 (×2): 5 mg via ORAL
  Filled 2013-01-25 (×2): qty 1

## 2013-01-25 MED ORDER — ONDANSETRON HCL 4 MG/2ML IJ SOLN
4.0000 mg | Freq: Four times a day (QID) | INTRAMUSCULAR | Status: DC | PRN
  Administered 2013-01-25 – 2013-01-27 (×3): 4 mg via INTRAVENOUS
  Filled 2013-01-25 (×3): qty 2

## 2013-01-25 MED ORDER — LORAZEPAM 1 MG PO TABS
0.5000 mg | ORAL_TABLET | Freq: Once | ORAL | Status: AC
  Administered 2013-01-25: 0.5 mg via ORAL
  Filled 2013-01-25: qty 1

## 2013-01-25 MED ORDER — ASPIRIN 81 MG PO CHEW
CHEWABLE_TABLET | ORAL | Status: AC
  Administered 2013-01-25: 19:00:00 243 mg
  Filled 2013-01-25: qty 3

## 2013-01-25 MED ORDER — SODIUM CHLORIDE 0.9 % IJ SOLN: 3.0000 mL | Freq: Two times a day (BID) | INTRAMUSCULAR | Status: DC

## 2013-01-25 MED ORDER — MORPHINE SULFATE 2 MG/ML IJ SOLN
2.0000 mg | Freq: Once | INTRAMUSCULAR | Status: AC
  Administered 2013-01-25: 2 mg via INTRAVENOUS
  Filled 2013-01-25: qty 1

## 2013-01-25 MED ORDER — ACETAMINOPHEN 650 MG RE SUPP: 650.0000 mg | Freq: Four times a day (QID) | RECTAL | Status: DC | PRN

## 2013-01-25 MED ORDER — ACETAMINOPHEN 325 MG PO TABS: 650.0000 mg | ORAL_TABLET | Freq: Four times a day (QID) | ORAL | Status: DC | PRN

## 2013-01-25 MED ORDER — SODIUM CHLORIDE 0.9 % IV BOLUS (SEPSIS)
500.0000 mL | Freq: Once | INTRAVENOUS | Status: AC
  Administered 2013-01-25: 500 mL via INTRAVENOUS

## 2013-01-25 MED ORDER — SODIUM CHLORIDE 0.9 % IJ SOLN: 3.0000 mL | INTRAMUSCULAR | Status: DC | PRN

## 2013-01-25 MED ORDER — ASPIRIN 81 MG PO TABS
243.0000 mg | ORAL_TABLET | Freq: Every day | ORAL | Status: DC
  Filled 2013-01-25 (×5): qty 3

## 2013-01-25 MED ORDER — IRBESARTAN 150 MG PO TABS
150.0000 mg | ORAL_TABLET | Freq: Every day | ORAL | Status: DC
  Administered 2013-01-25 – 2013-01-28 (×4): 150 mg via ORAL
  Filled 2013-01-25 (×4): qty 1

## 2013-01-25 MED ORDER — GADOBENATE DIMEGLUMINE 529 MG/ML IV SOLN
10.0000 mL | Freq: Once | INTRAVENOUS | Status: AC | PRN
  Administered 2013-01-25: 10 mL via INTRAVENOUS

## 2013-01-25 MED ORDER — ASPIRIN EC 81 MG PO TBEC
243.0000 mg | DELAYED_RELEASE_TABLET | Freq: Every day | ORAL | Status: DC
  Administered 2013-01-26 – 2013-01-27 (×2): 243 mg via ORAL
  Filled 2013-01-25 (×3): qty 3

## 2013-01-25 NOTE — Consult Note (Signed)
[name]  [phone]   HEMATOLOGY ONCOLOGY CONSULTATION   Erin Branch  DOB: 1933/05/11  MR#: [id number]  CSN#: [id number]    Requesting Physician: Teaching Service  Primary MD: Dr. Delena Bali, [place]  Reason for Consultation: Breast Cancer  History of present illness:79 y.o. white female from [place], [place], with initial history of T1c, N0 Grade 2/3 Invasive Ductal Carcinoma per biopsy on 04/27/04, ER+59%, PR+ 48%, proliferative index 8%, Her-2 neg by [facility]  for a 2.2x 1.5x 2.3 cm lesion in the upper outer quadrant of the right breast per MRI scan on 05/01/04. No lesions were present on the left breast. She underwent lumpectomy at the time with sentinel lymph node resection on 05/07/04. Final pathology showed  1.5 cm, grade 2/3, IDC, negative margins, the largest 0.3 cm, and 2 negative  sentinel lymph nodes.She was referred to Dr. Truddie Coco for evaluation. Although chemotherapy was recommended, she politely declined. She also declined radiation therapy despite recommendations to do so. Her last mammogram was on 06/26/2007, negative . Her health status was complicated by   acute small infarcts in the cerebellar hemispheres and the left pons and multiple TIAs in 05/2012, as she presented to ED with possible CVA, diagnosed with basilar artery stroke. 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 She was discharged on aspirin 81 mg, Plavix 75 mg, and Lipitor 80 mg for secondary prevention.She had excellent recovery, with minimal fatigue, minor incoordination. In December of 2014, patient began to experiencing worsening back pain, which initially was believed to be arthritic in nature. She developed a limping gait as a consequence. She was more short of breath and myalgias.  On 01/25/2013, she presented to the ED  with 3 day history of  intractable back pain, extended to pelvis.Per family report, she also had urinary incontinence overnight (rule out compression). She also had non-bloody emesis prior to admission.  CT of the abdomen and pelvis without contrast revealed multiple lytic lesions in spine and in pelvis (without intraabdominal abnormality) worrisome for malignancy.MRI of the L spine with contrast  On 1/8 showed diffuse altered signal intensity of all visualized bone marrow. Diffuse heterogeneous enhancement suspicious for tumor whether metastatic disease, lymphoma or myeloma. Although there may be minimal expansion of bony elements, no significant epidural spread of tumor is detected.  L1 superior endplate Schmorl's node deformity/compression fracture with slight retropulsion minimally more notable to the left. Calcium was elevated at 11.4, Cr 1.0, sodium 142. H/H 9.5/29.4, WBC 8.4 and platelets 308. No LDH or tumor markers have been ordered (CA 27.29). Tissue diagnosis is needed.  Suspecting recurrent breast cancer versus multiple myeloma versus other malignancy, we have been requested to see the patient in consultation .         Past medical history:      Past Medical History  Diagnosis Date  . Hypertension   . Carcinoma of breast, stage 1 2006    s/p Right partial mastectomy  . Stroke 06/13/12    Bilateral cerebellum; also left pons    Past surgical history:      Past Surgical History  Procedure Laterality Date  . Partial mastectomy Right 04/2004    Rudell Cobb. Young, M.D.   . Breast surgery      Medications:  . aspirin EC  243 mg Oral Daily  . aspirin  243 mg Oral Daily  .  heparin  5,000 Units Subcutaneous Q8H  . irbesartan  150 mg Oral Daily  . OxyCODONE  15 mg Oral Q12H  . sodium chloride  3 mL Intravenous Q12H  . sodium chloride  3 mL Intravenous Q12H   sodium chloride, acetaminophen, acetaminophen, morphine injection, ondansetron (ZOFRAN) IV, ondansetron, oxyCODONE, sodium  chloride  Allergies:  Allergies  Allergen Reactions  . Contrast Media [Iodinated Diagnostic Agents] Nausea And Vomiting  . Fish Allergy     Throat closes up, throwing up.  Marland Kitchen Penicillins Itching and Swelling  . Sulfa Antibiotics     Family history:     Family History  Problem Relation Age of Onset  . Heart disease Mother   . Throat cancer Father                              Social history:   Married  5 Children,lives in [place]. No ETOH, tobacco, or recreational drug use.   Code Status: Full Code    Health maintenance:              MGM: 2009             Colonoscopy:does not recall  HWE:XHBZ not recall    Review of systems:   Constitutional Positive for 30 lbs weight loss over 1 year. Negative for fever,night sweats,chills  Eyes: Negative for blurred vision and double vision.  Respiratory: Negative for cough, hemoptysis or shortness of breath.  Cardiovascular: Negative for chest pain. Negative for palpitations.  GI: No nausea, vomiting as above, diarrhea, or constipation. No change in bowel caliber. No  Melena  GU, remarkable for incontinence, acute. Skin: Negative for itching. No rash. No petechia. No bruising.  Neurological: No headaches.unable to walk due to severe pain..No confusion.     Physical exam:      Filed Vitals:   01/25/13 1315  BP: 159/58  Pulse: 65  Temp:   Resp: 24    Weight change:   Patient taken to radiology, unable to perform exam General:  80 y.o. female   in no acute distress A. and O. x3  well-developed and well-nourished.  HEENT: Normocephalic, atraumatic, PERRLA. Oral cavity without thrush or lesions. Neck supple. no thyromegaly, no cervical or supraclavicular adenopathy  Lungs clear bilaterally . No wheezing, rhonchi or rales. No axillary masses. Breasts:  Cardiac regular rate and rhythm, no murmur , rubs or gallops Abdomen soft nontender,  bowel sounds x4. No HSM. No masses palpable.  GU/rectal: deferred. Extremities no  clubbing, no  cyanosis or edema. No bruising or petechial rash Musculoskeletal: no spinal tenderness.  Neuro: Non Focal   Lab results:       CBC  Recent Labs Lab 01/25/13 0842  WBC 8.4  HGB 9.5*  HCT 29.4*  PLT 308  MCV 81.4  MCH 26.3  MCHC 32.3  RDW 15.4  LYMPHSABS 2.8  MONOABS 0.4  EOSABS 0.1  BASOSABS 0.0    Anemia panel:  No results found for this basename: VITAMINB12, FOLATE, FERRITIN, TIBC, IRON, RETICCTPCT,  in the last 72 hours   Chemistries   Recent Labs Lab 01/25/13 0522  NA 142  K 3.9  CL 102  CO2 27  GLUCOSE 95  BUN 15  CREATININE 1.00  CALCIUM 11.4*     Coagulation profile No results found for this basename: INR, PROTIME,  in the last 168 hours  Urine Studies No results found for this basename: UACOL, UAPR, USPG,  UPH, UTP, UGL, UKET, UBIL, UHGB, UNIT, UROB, ULEU, UEPI, UWBC, URBC, UBAC, CAST, CRYS, UCOM, BILUA,  in the last 72 hours  Studies:      Ct Abdomen Pelvis Wo Contrast  01/25/2013   CLINICAL DATA:  Abdominal pain.  Breast cancer.  EXAM: CT ABDOMEN AND PELVIS WITHOUT CONTRAST  TECHNIQUE: Multidetector CT imaging of the abdomen and pelvis was performed following the standard protocol without intravenous contrast.  COMPARISON:  None.  FINDINGS: Mild atelectasis in the lung bases.  Unenhanced images of the liver are negative. Gallbladder and bile ducts are normal. Pancreas and spleen are normal.  Negative for renal calculi. No renal obstruction or mass. Atherosclerotic aorta without aneurysm.  Negative for bowel obstruction or bowel thickening. No adenopathy. Foley catheter in the bladder which appears normal.  Extensive and diffuse skeletal abnormality throughout the spine and pelvis with multiple lytic lesions as well as sclerotic lesions. Findings are compatible with diffuse tumor. This may represent myeloma or metastatic disease. I would favor multiple myeloma. Disc degeneration with anterior slip at L2-3, L3-4, and L4-5. Mild fracture the L2  superior endplate.  IMPRESSION: No acute intra-abdominal abnormality.  Extensive and diffuse skeletal abnormality compatible with widespread myeloma or metastatic disease. Mild fracture of L2.   Electronically Signed   By: Franchot Gallo M.D.   On: 01/25/2013 08:24      A/P: 80 y.o. Female with prior history of T1c, N0, IDC s/p Right lumpectomy in 2006,without chemo or radiation per patient's choice,  asked to see in consultation for evaluation of possible recurrence, versus multiple myeloma versus other malignancy. Will need tissue diagnosis. In the interim, check  CA 27.29, LDH, B2 microglobulins, and may need radiation oncology evaluation if cord compression is suspected, versus neurosurgical consult. To be determined by Oncologist. If myeloma is suspected, other tests including Sed rate, quantitative immunoglobulins,serum light chains, metastatic bone survey, uric acid, 24 hr urine, Epo levels and possible Bone marrow biopsy will be performed. Save smear to rule out rouleaux, pathognomonic of multiple myeloma Dr.  Humphrey Rolls   is to see the patient following this consult with recommendations regarding diagnosis, treatment options and further workup studies. Addendum to this note to be written. Thank you for the referral.   Rondel Jumbo, PA-C 01/25/2013  ATTENDING'S ATTESTATION:  I personally reviewed patient's chart, examined patient myself, formulated the treatment plan as followed.    Patient has a prior history from 2006 of diagnosis of early stage invasive ductal carcinoma that was ER/PR positive. She underwent lumpectomy. She was found to be stage I. She was seen by one of my partners Dr. Rich Reining. He did recommend chemotherapy and radiation therapy. Patient declined. Since then she has also been lost to followup. She does have her history of strokes and TIAs. She now presents with back pain. CT scans revealed multiple lytic lesions in spine and pelvis. This was worrisome. Patient was admitted  to for further evaluation and management.  Patient is seen in consultation for further evaluation for possible metastatic carcinoma. Differential does include metastatic breast cancer versus myeloma or a lymphoma.  I have discussed the differential with the patient as well as the family today. We certainly do need further workup  For multiple myeloma as well as breast cancer. We will proceed with workup for multiple myeloma as well as breast cancer.  Bone marrow biopsy is recommended. I have discussed the rationale of this with the patient as well as her family.she will need a PET scan  as well.  Of note patient is not too keen on taking any form of chemotherapy or radiation therapy. Certainly we will continue to have discussions regarding this.  Marcy Panning, MD Medical/Oncology [facility] [phone] (beeper) [phone] (Office)  01/25/2013, 9:58 PM

## 2013-01-25 NOTE — ED Notes (Addendum)
Internal Medicine at bedside.  

## 2013-01-25 NOTE — Progress Notes (Signed)
Pt arrived to room 5W32 from ED via stretcher. Pt a&ox3, pt in no distress, skin intact, foley intact and clamped. Clamp removed from foley to allow to drain. Pt accompanied by several family members. Pt assisted to bed, pt oriented to room. Call bell in read, bed in lowest position, wheels locked. Pt educated on safety and how to contact nurse, pt verbalized understanding. Will continue to monitor pt.

## 2013-01-25 NOTE — ED Notes (Signed)
Pt attempted to sit/stand at edge of bed. Pt in extreme pain and only able to momentarily stand. Pt lightheaded when standing and became nauseous.

## 2013-01-25 NOTE — Progress Notes (Signed)
Placed on telemetry box 18, pt. SR with HR 73.  Alphonzo Lemmings, RN

## 2013-01-25 NOTE — ED Notes (Signed)
Family at bedside. 

## 2013-01-25 NOTE — ED Notes (Signed)
Pt brought in by [facility], Pt c/o abdominal pain x24 hours, with accompanying nausea, pt LBM was over 3 days ago, Pt had stroke in May 2014, Pt BP 170/80 HR 90, Pt sats 98% on room air, Pt c/o pain in lower right hand back that radiates towards her right abdomen< Pt is incontinent of urine

## 2013-01-25 NOTE — ED Provider Notes (Signed)
CSN: [id number]     Arrival date & time 01/25/13  0500 History   First MD Initiated Contact with Patient 01/25/13 [phone]     Chief Complaint  Patient presents with  . Abdominal Pain   (Consider location/radiation/quality/duration/timing/severity/associated sxs/prior Treatment) HPI Patient presents with greater than 24 hours of right sided flank and lower back pain radiating around to the right of her abdomen. She's also had urinary incontinence with this. She denies any focal leg weakness or numbness. The pain in her back is worse with any movement. She denies any trauma or heavy lifting. She's had some nausea without any vomiting. She states she's been constipated for the last 3 days. This is abnormal for the patient. Denies any blood in her urine or in her stool. She's had no fever or chills. Past Medical History  Diagnosis Date  . Hypertension   . Carcinoma of breast, stage 1 2006    s/p Right partial mastectomy  . Stroke 06/13/12    Bilateral cerebellum; also left pons   Past Surgical History  Procedure Laterality Date  . Partial mastectomy Right 04/2004    Rudell Cobb. Young, M.D.   . Breast surgery     Family History  Problem Relation Age of Onset  . Heart disease Mother   . Throat cancer Father    History  Substance Use Topics  . Smoking status: Never Smoker   . Smokeless tobacco: Never Used  . Alcohol Use: No   OB History   Grav Para Term Preterm Abortions TAB SAB Ect Mult Living                 Review of Systems  Constitutional: Negative for fever and chills.  Respiratory: Negative for cough and shortness of breath.   Cardiovascular: Negative for chest pain.  Gastrointestinal: Positive for nausea, abdominal pain and constipation. Negative for vomiting and diarrhea.  Genitourinary: Positive for frequency and flank pain. Negative for dysuria, vaginal bleeding, vaginal discharge, difficulty urinating and pelvic pain.  Musculoskeletal: Negative for back pain, myalgias, neck  pain and neck stiffness.  Skin: Negative for rash and wound.  Neurological: Negative for dizziness, syncope, weakness, numbness and headaches.  All other systems reviewed and are negative.    Allergies  Contrast media; Fish allergy; Penicillins; and Sulfa antibiotics  Home Medications   Current Outpatient Rx  Name  Route  Sig  Dispense  Refill  . amLODipine (NORVASC) 5 MG tablet   Oral   Take 5 mg by mouth daily.         Marland Kitchen aspirin 81 MG tablet   Oral   Take 243 mg by mouth daily.          . Chlorpheniramine-DM (CORICIDIN HBP COUGH/COLD PO)   Oral   Take 1 tablet by mouth once.         . hydrochlorothiazide (HYDRODIURIL) 25 MG tablet   Oral   Take 1 tablet (25 mg total) by mouth daily.   30 tablet   1   . levofloxacin (LEVAQUIN) 750 MG tablet   Oral   Take 750 mg by mouth daily.         . promethazine-codeine (PHENERGAN WITH CODEINE) 6.25-10 MG/5ML syrup   Oral   Take 5-10 mLs by mouth at bedtime as needed and may repeat dose one time if needed for cough.         . valsartan (DIOVAN) 160 MG tablet   Oral   Take 160 mg by mouth daily.  Temp(Src) 98.2 F (36.8 C) (Oral)  Ht 5\' 3"  (1.6 m)  Wt 125 lb (56.7 kg)  BMI 22.15 kg/m2  SpO2 96% Physical Exam  Nursing note and vitals reviewed. Constitutional: She is oriented to person, place, and time. She appears well-developed and well-nourished. No distress.  HENT:  Head: Normocephalic and atraumatic.  Mouth/Throat: Oropharynx is clear and moist.  Eyes: EOM are normal. Pupils are equal, round, and reactive to light.  Neck: Normal range of motion. Neck supple.  Cardiovascular: Normal rate and regular rhythm.   Pulmonary/Chest: Effort normal and breath sounds normal. No respiratory distress. She has no wheezes. She has no rales. She exhibits no tenderness.  Abdominal: Soft. Bowel sounds are normal. She exhibits no distension and no mass. There is tenderness (mild suprapubic tenderness to palpation.  Patient has no rebound or guarding. No obvious masses.). There is no rebound and no guarding.  Musculoskeletal: Normal range of motion. She exhibits tenderness. She exhibits no edema.  Patient is very tender to palpation on her right flank and right paraspinal muscles. She has no midline thoracic or lumbar tenderness. She has negative straight-leg raises.  Neurological: She is alert and oriented to person, place, and time.  Patient is alert and oriented x3 with clear, goal oriented speech. Patient has 5/5 motor in all extremities. Sensation is intact to light touch.  Skin: Skin is warm and dry. No rash noted. No erythema.  Psychiatric: She has a normal mood and affect. Her behavior is normal.    ED Course  Procedures (including critical care time) Labs Review Labs Reviewed  CBC WITH DIFFERENTIAL  COMPREHENSIVE METABOLIC PANEL  TROPONIN I  LIPASE, BLOOD  URINALYSIS, ROUTINE W REFLEX MICROSCOPIC   Imaging Review No results found.  EKG Interpretation   None       MDM   Patient signed out to oncoming emergency physician pending CT abdomen and pelvis.   Julianne Rice, MD 01/25/13 0700

## 2013-01-25 NOTE — H&P (Signed)
Hospital Admission Note Date: 01/25/2013  Patient name: Erin Branch Medical record number: [id number] Date of birth: 11-Jan-1934 Age: 80 y.o. Gender: female PCP: Nicoletta Dress, MD in [place]: IMTS   Attending physician: Dr. Daryll Drown  Internal Medicine Teaching Service Contact Information  Weekday Hours (7AM-5PM):   1st contact:  Dr. Mechele Claude               Pgr:  [phone] 2nd contact:  Dr. Jerene Pitch    pgr:  [phone]  ** If no return call within 15 minutes (after trying both pagers listed above), please call after hours pagers.   After 5 pm or weekends: 1st Contact: Pager: [phone] 2nd Contact: Pager: [phone]  Chief Complaint: back pain and urinary incontinence.  History of Present Illness:  Patient is a 80 year old lady with past medical history of stroke, hypertension, hyperlipidemia, breast cancer (S/P partial mastectomy 2006), who presents with sever back pain and urinary incontinence.   Patient reports that she has never been back to her [facility] since she was diagonsed with stroke 05/2012. She has poor balance and feels that her body is not right. She is currently taking ASA 324 mg daily for secondary stroke prevention and Diovan for HTN. Three days ago, she started having back pain, which is located at the lower back, radiating to the right flank and abdominal area. It is associated with nausea and vomiting. She vomited 3 or 4 times, without blood in the vomitus. No diarrhea. The back pain is progressively getting worse, currently 10 out of 10 in severity. It is so severe that she can not move and walk. Last night she developed urinary incontinence. He does not have symptoms for UTI, such as dysuria or urgency. No focal leg weakness or numbness. Patient was found to have extensive and diffuse skeletal abnormality on CT scan, compatible with widespread myeloma or metastatic disease per radiologist.  Of note, patient was diagnosed  with right side breast cancer in 2006. She is s/p of partial mastectomy without radiation and chemotherapy. She has been followed up by oncologist until 2009. Her last mammogram was 4 years ago, which was normal. She did not notice any new lumps in breasts. She reports weight loss of 30 Lbs over past 6 months. Her father died of lung cancer and sister died fo colon cancer.   ROS:  Denies fever, chills, headaches, chest pain, SOB, diarrhea, constipation, dysuria, urgency, hematuria. Has nausea, vomiting, weakness. .  Meds: Current Outpatient Rx  Name  Route  Sig  Dispense  Refill  . amLODipine (NORVASC) 5 MG tablet   Oral   Take 5 mg by mouth daily.         Marland Kitchen aspirin 81 MG tablet   Oral   Take 243 mg by mouth daily.          . Chlorpheniramine-DM (CORICIDIN HBP COUGH/COLD PO)   Oral   Take 1 tablet by mouth once.         . hydrochlorothiazide (HYDRODIURIL) 25 MG tablet   Oral   Take 1 tablet (25 mg total) by mouth daily.   30 tablet   1   . levofloxacin (LEVAQUIN) 750 MG tablet   Oral   Take 750 mg by mouth daily.         . promethazine-codeine (PHENERGAN WITH CODEINE) 6.25-10 MG/5ML syrup   Oral   Take 5-10 mLs by mouth at bedtime as needed and may repeat dose one time if  needed for cough.         . valsartan (DIOVAN) 160 MG tablet   Oral   Take 160 mg by mouth daily.           Allergies: Allergies as of 01/25/2013 - Review Complete 01/25/2013  Allergen Reaction Noted  . Contrast media [iodinated diagnostic agents] Nausea And Vomiting 06/13/2012  . Fish allergy  01/25/2013  . Penicillins Itching and Swelling 06/13/2012  . Sulfa antibiotics  08/22/2012   Past Medical History  Diagnosis Date  . Hypertension   . Carcinoma of breast, stage 1 2006    s/p Right partial mastectomy  . Stroke 06/13/12    Bilateral cerebellum; also left pons   Past Surgical History  Procedure Laterality Date  . Partial mastectomy Right 04/2004    Rudell Cobb. Young, M.D.   .  Breast surgery and appendectomy      Family History  Problem Relation Age of Onset  . Heart disease Mother   . Throat cancer Father    History   Social History  . Marital Status: Married    Spouse Name: Jeneen Rinks    Number of Children: 5  . Years of Education: 12th   Occupational History  . retired     n/a   Social History Main Topics  . Smoking status: Never Smoker   . Smokeless tobacco: Never Used  . Alcohol Use: No  . Drug Use: No  . Sexual Activity: No   Other Topics Concern  . Not on file   Social History Narrative   Patient lives at home with spouse.    Caffeine Use:  None; rarely 1 cup day    Review of Systems: Full 14-point review of systems otherwise negative except as noted above in HPI.  Physical Exam:   Filed Vitals:   01/25/13 0530 01/25/13 0915 01/25/13 1104 01/25/13 1159  BP: 175/69 168/63  143/53  Pulse: 64 65 62 68  Temp:      TempSrc:      Resp:  $Remo'20 19 16  'jdLhF$ Height:      Weight:      SpO2: 96% 94% 95%     General: Not in acute distress, but in pain.  HEENT: PERRL, EOMI, no scleral icterus, No JVD or bruit Cardiac: S1/S2, RRR, No murmurs, gallops or rubs Pulm: Good air movement bilaterally. Clear to auscultation bilaterally. No rales, wheezing, rhonchi or rubs. Breast: There is no lumps in both breasts. No lymph node enlargement in neck or axillary area.  Abd: Soft, nondistended, Tender over the right side of abdomen and flank area. no rebound pain, no organomegaly, BS present Ext: No edema. 2+DP/PT pulse bilaterally Musculoskeletal: Severe tenderness to palpation on her right flank and paraspinal muscles, less tender over the midline of lumbar spine. She has negative straight-leg raises.  Skin: No rashes.  Neuro: Alert and oriented X3, cranial nerves II-XII grossly intact, muscle strength 5/5 in all extremeties, sensation to light touch intact. Brachial reflex 2+ bilaterally. Knee reflex 1+ bilaterally. Negative Babinski's sign. Normal finger to  nose test. Proprioception is intact (can tell great toe is up or down biltaterly).  Psych: Patient is not psychotic, no suicidal or hemocidal ideation.  Anal exam: no hemorrhoid. Anal tone is normal.   Lab results: Basic Metabolic Panel:  Recent Labs  01/25/13 0522  NA 142  K 3.9  CL 102  CO2 27  GLUCOSE 95  BUN 15  CREATININE 1.00  CALCIUM 11.4*   Liver Function  Tests:  Recent Labs  01/25/13 0522  AST 24  ALT 11  ALKPHOS 137*  BILITOT 0.4  PROT 7.2  ALBUMIN 3.2*    Recent Labs  01/25/13 0522  LIPASE 39   No results found for this basename: AMMONIA,  in the last 72 hours CBC:  Recent Labs  01/25/13 0842  WBC 8.4  NEUTROABS 5.1  HGB 9.5*  HCT 29.4*  MCV 81.4  PLT 308   Cardiac Enzymes:  Recent Labs  01/25/13 0538  TROPONINI <0.30   BNP: No results found for this basename: PROBNP,  in the last 72 hours D-Dimer: No results found for this basename: DDIMER,  in the last 72 hours CBG: No results found for this basename: GLUCAP,  in the last 72 hours Hemoglobin A1C: No results found for this basename: HGBA1C,  in the last 72 hours Fasting Lipid Panel: No results found for this basename: CHOL, HDL, LDLCALC, TRIG, CHOLHDL, LDLDIRECT,  in the last 72 hours Thyroid Function Tests: No results found for this basename: TSH, T4TOTAL, FREET4, T3FREE, THYROIDAB,  in the last 72 hours Anemia Panel: No results found for this basename: VITAMINB12, FOLATE, FERRITIN, TIBC, IRON, RETICCTPCT,  in the last 72 hours Coagulation: No results found for this basename: LABPROT, INR,  in the last 72 hours Urine Drug Screen: Drugs of Abuse  No results found for this basename: labopia,  cocainscrnur,  labbenz,  amphetmu,  thcu,  labbarb    Alcohol Level: No results found for this basename: ETH,  in the last 72 hours Urinalysis:  Recent Labs  01/25/13 0608  COLORURINE YELLOW  LABSPEC 1.006  PHURINE 7.5  GLUCOSEU NEGATIVE  HGBUR NEGATIVE  BILIRUBINUR NEGATIVE   KETONESUR NEGATIVE  PROTEINUR NEGATIVE  UROBILINOGEN 0.2  NITRITE NEGATIVE  [place]. Labs:  Imaging results:  Ct Abdomen Pelvis Wo Contrast  01/25/2013   CLINICAL DATA:  Abdominal pain.  Breast cancer.  EXAM: CT ABDOMEN AND PELVIS WITHOUT CONTRAST  TECHNIQUE: Multidetector CT imaging of the abdomen and pelvis was performed following the standard protocol without intravenous contrast.  COMPARISON:  None.  FINDINGS: Mild atelectasis in the lung bases.  Unenhanced images of the liver are negative. Gallbladder and bile ducts are normal. Pancreas and spleen are normal.  Negative for renal calculi. No renal obstruction or mass. Atherosclerotic aorta without aneurysm.  Negative for bowel obstruction or bowel thickening. No adenopathy. Foley catheter in the bladder which appears normal.  Extensive and diffuse skeletal abnormality throughout the spine and pelvis with multiple lytic lesions as well as sclerotic lesions. Findings are compatible with diffuse tumor. This may represent myeloma or metastatic disease. I would favor multiple myeloma. Disc degeneration with anterior slip at L2-3, L3-4, and L4-5. Mild fracture the L2 superior endplate.  IMPRESSION: No acute intra-abdominal abnormality.  Extensive and diffuse skeletal abnormality compatible with widespread myeloma or metastatic disease. Mild fracture of L2.   Electronically Signed   By: Franchot Gallo M.D.   On: 01/25/2013 08:24    Other results:  EKG: Sinus rhythm, regular, normal axis, normal R wave progression, normal QT interval, TWI in V2 to V4  Assessment & Plan by Problem:  Patient is a 80 year old lady with past medical history of stroke, hypertension, hyperlipidemia, breast cancer (S/P partial mastectomy 2006), who presents with sever back pain and urinary incontinence. Patient was found to have extensive and diffuse skeletal abnormality on CT scan, compatible with widespread myeloma or metastatic disease per  radiologist. Ca=11.4.   #:  Back pain: It is likely due to metastasized breast cancer given her hx of breast cancer, also possible for MM as suggested by radiologist. Infectious etiology is unlikely, given that patient is afebrile and no leukocytosis. Kidney stone was ruled out by negative CT scan.  Her calcium level is slightly elevated, which is consistent with metastasized bone disease. Currently she is hemodynamically stable. There is no focal neurological findings on physical exam. Her urinary incontinence may be due to severe pain, but need to rule out spinal cord compression.  -will admit to tele bed -will consult to Oncology -pain control: MS Contin 15 mg twice a day, oxycodone 5 mg when necessary, Tylenol -will get MRI-lumbar spine -zofran for nausea  #: Hypertension: bp is 168/63, likely partially due to pain. She is on Diovan 160 mg daily at home.  -will switch to Avapro Gentry Fitz) 150 mg daily while in hospital.   #: Hyperlipidemia: LDL was 166 on 06/14/12. Not on medications at home. -need to start statin as outpt  # hx of Breast cancer: s/p of lumpectomy. She has been followed up by oncologist until 2009. Her last mammogram was 4 years ago, which was normal. She did not notice any new lumps in breasts. No new lumps or enlarged node found on physical exam.  -will consult oncology as above.   # hx of stroke: diagnosed in 5/204. No significant neurologic deficit, except for poor balance.  -will continue  Home ASA   #: abnormal EKG: Patient has T wave inversion in V2 through V4, which is new. Patient does not have chest pain currently. Initial poc trop negative in Ed.  - trop q6h x 3    #  F/E/N  -NS: IV 75 cc/hr -Electrolytes: fine on admission -Diet: regular  # DVT px: Heparin sq   Dispo: Disposition is deferred at this time, awaiting improvement of current medical problems. Anticipated discharge in approximately 1 to 2 day(s).   The patient does have a current PCP  (SCHULTZ,DOUGLAS E, MD), therefore is requiring OPC follow-up after discharge.   The patient does not have transportation limitations that hinder transportation to clinic appointments.  Signed:  Ivor Costa, MD PGY3, Internal Medicine Teaching Service Pager: [phone]  01/25/2013, 12:07 PM

## 2013-01-25 NOTE — ED Notes (Signed)
Pt in MR

## 2013-01-25 NOTE — ED Notes (Signed)
Patient still in MRI.  

## 2013-01-25 NOTE — ED Provider Notes (Signed)
Case was signed out to me by Doctor Lita Mains to follow up on CAT scan report patient has had 3 days of progressively worsening back and pelvic pain. CAT scan was performed to further evaluate. CT scan reveals multiple lytic lesions in the spine and pelvis which explain the patient's pain. Reviewing the patient's records reveals previous history of breast cancer. CAT scan findings are concerning for either recurrent metastatic breast cancer or new multiple myeloma. Will require further evaluation for this. I did discuss with her the possibility of outpatient workup for this, but have been unable to get the patient's pain under control. Patient was given multiple doses of morphine in the ER and cannot stand at the bedside due to her pain. She was therefore admitted to the hospital.  Orpah Greek, MD 01/25/13 [phone]

## 2013-01-25 NOTE — Progress Notes (Addendum)
Received report from ED. Will await for the patient to arrive.

## 2013-01-26 ENCOUNTER — Observation Stay (HOSPITAL_COMMUNITY): Payer: Medicare Other

## 2013-01-26 ENCOUNTER — Encounter (HOSPITAL_COMMUNITY): Payer: Self-pay | Admitting: Radiology

## 2013-01-26 DIAGNOSIS — Z853 Personal history of malignant neoplasm of breast: Secondary | ICD-10-CM

## 2013-01-26 DIAGNOSIS — Z8673 Personal history of transient ischemic attack (TIA), and cerebral infarction without residual deficits: Secondary | ICD-10-CM

## 2013-01-26 DIAGNOSIS — M549 Dorsalgia, unspecified: Secondary | ICD-10-CM

## 2013-01-26 DIAGNOSIS — I10 Essential (primary) hypertension: Secondary | ICD-10-CM

## 2013-01-26 DIAGNOSIS — E785 Hyperlipidemia, unspecified: Secondary | ICD-10-CM

## 2013-01-26 LAB — BASIC METABOLIC PANEL
BUN: 17 mg/dL (ref 6–23)
CHLORIDE: 104 meq/L (ref 96–112)
CO2: 23 meq/L (ref 19–32)
Calcium: 11.3 mg/dL — ABNORMAL HIGH (ref 8.4–10.5)
Creatinine, Ser: 1.25 mg/dL — ABNORMAL HIGH (ref 0.50–1.10)
GFR calc non Af Amer: 40 mL/min — ABNORMAL LOW (ref >90)
GFR, EST AFRICAN AMERICAN: 47 mL/min — AB (ref >90)
Glucose, Bld: 82 mg/dL (ref 70–99)
Potassium: 4.5 mEq/L (ref 3.7–5.3)
Sodium: 142 mEq/L (ref 137–147)

## 2013-01-26 LAB — CANCER ANTIGEN 27.29: CA 27.29: 405 U/mL — ABNORMAL HIGH (ref 0–39)

## 2013-01-26 LAB — BONE MARROW EXAM

## 2013-01-26 LAB — BETA 2 MICROGLOBULIN, SERUM: Beta-2 Microglobulin: 3.68 mg/L — ABNORMAL HIGH (ref 1.01–1.73)

## 2013-01-26 LAB — TROPONIN I: Troponin I: <0.3 ng/mL (ref <0.30)

## 2013-01-26 MED ORDER — FENTANYL CITRATE 0.05 MG/ML IJ SOLN
INTRAMUSCULAR | Status: AC
  Filled 2013-01-26: qty 4

## 2013-01-26 MED ORDER — MIDAZOLAM HCL 2 MG/2ML IJ SOLN
INTRAMUSCULAR | Status: AC | PRN
  Administered 2013-01-26: 10:00:00 1 mg via INTRAVENOUS

## 2013-01-26 MED ORDER — MIDAZOLAM HCL 2 MG/2ML IJ SOLN
INTRAMUSCULAR | Status: AC
  Filled 2013-01-26: qty 4

## 2013-01-26 MED ORDER — FENTANYL CITRATE 0.05 MG/ML IJ SOLN
INTRAMUSCULAR | Status: AC | PRN
  Administered 2013-01-26: 50 ug via INTRAVENOUS

## 2013-01-26 MED ORDER — LIDOCAINE HCL 1 % IJ SOLN
INTRAMUSCULAR | Status: AC
  Filled 2013-01-26: qty 10

## 2013-01-26 NOTE — H&P (Signed)
  Date: 01/26/2013  Patient name: Erin Branch  Medical record number: [id number]  Date of birth: 1933/10/09   I have seen and evaluated Erin Branch and discussed their care with the Residency Team. Briefly, Erin Branch is a 80yo woman with PMH of stroke, HTN, HLD, breast cancer who presented with severe back pain and urinary incontinence.  She noted the pain started 3 days ago and became so severe that she was unable to move.  It was associated with N/V and the night prior to admission she developed urinary incontinence.  She reports no focal leg weakness or numbness.  On exam she was able to move her LE and lift them to gravity, no weakness and normal anal tone.  She was noted on CT scan of the A/P to have diffuse skeletal lesions, including lytic lesions, concerning for widespread melanoma or metastatic disease.  She further has an elevated Ca.   Assessment and Plan: I have seen and evaluated the patient as outlined above. I agree with the formulated Assessment and Plan as detailed in the residents' admission note, with the following changes:   1. Back pain with widespread bony changes: Concerning for malignancy, either myeloma or metastatic breast cancer.  Oncology consult.  Pain control with oral pain medications and consider IV bisphosphonate.  Team ordered an MRI lumbar spine given urinary incontinence to assess for spinal cord involvement.   2. HTN: She will be on avapro while in hospital.   3. Abnl EKG: TWI in lateral leads, new.  Will evaluate with cardiac enzymes.   Other issues per resident note.   Sid Falcon, MD 1/9/20155:12 PM

## 2013-01-26 NOTE — Procedures (Signed)
Successful RT ILIAC BM BX NO COMP STABLE FULL REPORT IN PACS

## 2013-01-26 NOTE — H&P (Signed)
Chief Complaint: "Back pain." Referring Physician: Dr. Humphrey Rolls HPI: Erin Branch is an 80 y.o. female with PMHx of breast cancer who now presents with c/o lower back pain and right sided rib pain. She denies any current right hip pain or skin changes. She denies any previous right hip surgeries. She had imaging which revealed lytic lesions of the spine and pelvis. Dr. Humphrey Rolls is requesting an image guided bone marrow biopsy. She denies any chest pain or shortness of breath. She denies any active bleeding or history of clotting disorders. She denies any fever or chills. She denies any known complications to conscious sedation. She denies any history of sleep apnea or chronic home oxygen use. She does have a history of CVA and is concerned about laying on her stomach for the procedure with decrease in blood flow to her brain with certain head positions, I will discuss this with Dr. Annamaria Boots.  Past Medical History:  Past Medical History  Diagnosis Date  . Hypertension   . 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 Anxiety   . GERD (gastroesophageal reflux disease)   . Carcinoma of breast, stage 1 2006    s/p Right partial mastectomy  . Arthritis     Past Surgical History:  Past Surgical History  Procedure Laterality Date  . Partial mastectomy Right 04/2004    Rudell Cobb. Annamaria Boots, M.D.   . Breast surgery    . Appendectomy    . Ankle fracture surgery Left 1990    Family History:  Family History  Problem Relation Age of Onset  . Heart disease Mother   . Throat cancer Father     Social History:  reports that she has never smoked. She has never used smokeless tobacco. She reports that she does not drink alcohol or use illicit drugs.  Allergies:  Allergies  Allergen Reactions  . Contrast Media [Iodinated Diagnostic Agents] Nausea And Vomiting  . Fish Allergy     Throat closes up, throwing up.  Marland Kitchen Penicillins Itching and Swelling  . Sulfa Antibiotics       Medication List     ASK your doctor about these medications       amLODipine 5 MG tablet  Commonly known as:  NORVASC  Take 5 mg by mouth daily.     aspirin 81 MG tablet  Take 243 mg by mouth daily.     CORICIDIN HBP COUGH/COLD PO  Take 1 tablet by mouth once.     hydrochlorothiazide 25 MG tablet  Commonly known as:  HYDRODIURIL  Take 1 tablet (25 mg total) by mouth daily.     levofloxacin 750 MG tablet  Commonly known as:  LEVAQUIN  Take 750 mg by mouth daily.     promethazine-codeine 6.25-10 MG/5ML syrup  Commonly known as:  PHENERGAN with CODEINE  Take 5-10 mLs by mouth at bedtime as needed and may repeat dose one time if needed for cough.     valsartan 160 MG tablet  Commonly known as:  DIOVAN  Take 160 mg by mouth daily.       Please HPI for pertinent positives, otherwise complete 10 system ROS negative.  Physical Exam: BP 139/68  Pulse 60  Temp(Src) 97.7 F (36.5 C) (Axillary)  Resp 20  Ht $R'5\' 3"'dJ$  (1.6 m)  Wt 125 lb 10.6 oz (57 kg)  BMI 22.27 kg/m2  SpO2 89% Body mass index is 22.27 kg/(m^2).  General Appearance:  Alert, cooperative, no distress  Head:  Normocephalic, without obvious abnormality, atraumatic  Neck: Supple, symmetrical, trachea midline  Lungs:   Clear to auscultation bilaterally, no w/r/r, respirations unlabored without use of accessory muscles.  Chest Wall:  No tenderness or deformity  Heart:  Regular rate and rhythm, S1, S2 normal, no murmur, rub or gallop.  Abdomen:   Soft, non-tender, non distended, (+) BS  Extremities: Extremities normal, atraumatic, no cyanosis or edema  Pulses: 2+ and symmetric  Neurologic: Normal affect, no gross deficits.   Results for orders placed during the hospital encounter of 01/25/13 (from the past 48 hour(s))  COMPREHENSIVE METABOLIC PANEL     Status: Abnormal   Collection Time    01/25/13  5:22 AM      Result Value Range   Sodium 142  137 - 147 mEq/L   Potassium 3.9  3.7 - 5.3 mEq/L   Chloride 102  96 - 112 mEq/L   CO2  27  19 - 32 mEq/L   Glucose, Bld 95  70 - 99 mg/dL   BUN 15  6 - 23 mg/dL   Creatinine, Ser 1.00  0.50 - 1.10 mg/dL   Calcium 11.4 (*) 8.4 - 10.5 mg/dL   Total Protein 7.2  6.0 - 8.3 g/dL   Albumin 3.2 (*) 3.5 - 5.2 g/dL   AST 24  0 - 37 U/L   ALT 11  0 - 35 U/L   Alkaline Phosphatase 137 (*) 39 - 117 U/L   Total Bilirubin 0.4  0.3 - 1.2 mg/dL   GFR calc non Af Amer 53 (*) >90 mL/min   GFR calc Af Amer 61 (*) >90 mL/min   Comment: (NOTE)     The eGFR has been calculated using the CKD EPI equation.     This calculation has not been validated in all clinical situations.     eGFR's persistently <90 mL/min signify possible Chronic Kidney     Disease.  LIPASE, BLOOD     Status: None   Collection Time    01/25/13  5:22 AM      Result Value Range   Lipase 39  11 - 59 U/L  TROPONIN I     Status: None   Collection Time    01/25/13  5:38 AM      Result Value Range   Troponin I <0.30  <0.30 ng/mL   Comment:            Due to the release kinetics of cTnI,     a negative result within the first hours     of the onset of symptoms does not rule out     myocardial infarction with certainty.     If myocardial infarction is still suspected,     repeat the test at appropriate intervals.  URINALYSIS, ROUTINE W REFLEX MICROSCOPIC     Status: Abnormal   Collection Time    01/25/13  6:08 AM      Result Value Range   Color, Urine YELLOW  YELLOW   APPearance CLOUDY (*) CLEAR   Specific Gravity, Urine 1.006  1.005 - 1.030   pH 7.5  5.0 - 8.0   Glucose, UA NEGATIVE  NEGATIVE mg/dL   Hgb urine dipstick NEGATIVE  NEGATIVE   Bilirubin Urine NEGATIVE  NEGATIVE   Ketones, ur NEGATIVE  NEGATIVE mg/dL   Protein, ur NEGATIVE  NEGATIVE mg/dL   Urobilinogen, UA 0.2  0.0 - 1.0 mg/dL   Nitrite NEGATIVE  NEGATIVE   Leukocytes, UA NEGATIVE  NEGATIVE  Comment: MICROSCOPIC NOT DONE ON URINES WITH NEGATIVE PROTEIN, BLOOD, LEUKOCYTES, NITRITE, OR GLUCOSE <1000 mg/dL.  CBC WITH DIFFERENTIAL     Status:  Abnormal   Collection Time    01/25/13  8:42 AM      Result Value Range   WBC 8.4  4.0 - 10.5 K/uL   RBC 3.61 (*) 3.87 - 5.11 MIL/uL   Hemoglobin 9.5 (*) 12.0 - 15.0 g/dL   HCT 29.4 (*) 36.0 - 46.0 %   MCV 81.4  78.0 - 100.0 fL   MCH 26.3  26.0 - 34.0 pg   MCHC 32.3  30.0 - 36.0 g/dL   RDW 15.4  11.5 - 15.5 %   Platelets 308  150 - 400 K/uL   Neutrophils Relative % 61  43 - 77 %   Neutro Abs 5.1  1.7 - 7.7 K/uL   Lymphocytes Relative 33  12 - 46 %   Lymphs Abs 2.8  0.7 - 4.0 K/uL   Monocytes Relative 5  3 - 12 %   Monocytes Absolute 0.4  0.1 - 1.0 K/uL   Eosinophils Relative 1  0 - 5 %   Eosinophils Absolute 0.1  0.0 - 0.7 K/uL   Basophils Relative 0  0 - 1 %   Basophils Absolute 0.0  0.0 - 0.1 K/uL  TROPONIN I     Status: None   Collection Time    01/25/13  5:00 PM      Result Value Range   Troponin I <0.30  <0.30 ng/mL   Comment:            Due to the release kinetics of cTnI,     a negative result within the first hours     of the onset of symptoms does not rule out     myocardial infarction with certainty.     If myocardial infarction is still suspected,     repeat the test at appropriate intervals.  PROTIME-INR     Status: None   Collection Time    01/25/13  5:00 PM      Result Value Range   Prothrombin Time 13.5  11.6 - 15.2 seconds   INR 1.05  0.00 - 1.49  LACTATE DEHYDROGENASE     Status: None   Collection Time    01/25/13  7:14 PM      Result Value Range   LDH 195  94 - 250 U/L  CANCER ANTIGEN 27.29     Status: Abnormal   Collection Time    01/25/13  7:14 PM      Result Value Range   CA 27.29 405 (*) 0 - 39 U/mL   Comment: Performed at [facility]     Status: None   Collection Time    01/25/13  7:14 PM      Result Value Range   Smear Review SMEAR STAINED AND AVAILABLE FOR REVIEW    TROPONIN I     Status: None   Collection Time    01/26/13 12:33 AM      Result Value Range   Troponin I <0.30  <0.30 ng/mL   Comment:             Due to the release kinetics of cTnI,     a negative result within the first hours     of the onset of symptoms does not rule out     myocardial infarction with certainty.     If  myocardial infarction is still suspected,     repeat the test at appropriate intervals.  BASIC METABOLIC PANEL     Status: Abnormal   Collection Time    01/26/13  6:27 AM      Result Value Range   Sodium 142  137 - 147 mEq/L   Potassium 4.5  3.7 - 5.3 mEq/L   Chloride 104  96 - 112 mEq/L   CO2 23  19 - 32 mEq/L   Glucose, Bld 82  70 - 99 mg/dL   BUN 17  6 - 23 mg/dL   Creatinine, Ser 1.25 (*) 0.50 - 1.10 mg/dL   Calcium 11.3 (*) 8.4 - 10.5 mg/dL   GFR calc non Af Amer 40 (*) >90 mL/min   GFR calc Af Amer 47 (*) >90 mL/min   Comment: (NOTE)     The eGFR has been calculated using the CKD EPI equation.     This calculation has not been validated in all clinical situations.     eGFR's persistently <90 mL/min signify possible Chronic Kidney     Disease.   Ct Abdomen Pelvis Wo Contrast  01/25/2013   CLINICAL DATA:  Abdominal pain.  Breast cancer.  EXAM: CT ABDOMEN AND PELVIS WITHOUT CONTRAST  TECHNIQUE: Multidetector CT imaging of the abdomen and pelvis was performed following the standard protocol without intravenous contrast.  COMPARISON:  None.  FINDINGS: Mild atelectasis in the lung bases.  Unenhanced images of the liver are negative. Gallbladder and bile ducts are normal. Pancreas and spleen are normal.  Negative for renal calculi. No renal obstruction or mass. Atherosclerotic aorta without aneurysm.  Negative for bowel obstruction or bowel thickening. No adenopathy. Foley catheter in the bladder which appears normal.  Extensive and diffuse skeletal abnormality throughout the spine and pelvis with multiple lytic lesions as well as sclerotic lesions. Findings are compatible with diffuse tumor. This may represent myeloma or metastatic disease. I would favor multiple myeloma. Disc degeneration with anterior slip at  L2-3, L3-4, and L4-5. Mild fracture the L2 superior endplate.  IMPRESSION: No acute intra-abdominal abnormality.  Extensive and diffuse skeletal abnormality compatible with widespread myeloma or metastatic disease. Mild fracture of L2.   Electronically Signed   By: Franchot Gallo M.D.   On: 01/25/2013 08:24   Mr Lumbar Spine W Wo Contrast  01/25/2013   CLINICAL DATA:  History of breast cancer.  Abnormal CT.  EXAM: MRI LUMBAR SPINE WITHOUT AND WITH CONTRAST  TECHNIQUE: Multiplanar and multiecho pulse sequences of the lumbar spine were obtained without and with intravenous contrast.  CONTRAST:  9mL MULTIHANCE GADOBENATE DIMEGLUMINE 529 MG/ML IV SOLN  COMPARISON:  01/25/2013 CT.  FINDINGS: Last fully open disk space is labeled L5-S1. Present examination incorporates from T10-11 disc space through the S2 level.  Conus L1-2 level. No abnormal enhancement of the distal cord, conus or nerve roots.  Diffuse altered signal intensity of all visualized bone marrow. Diffuse heterogeneous enhancement. Findings suspicious for tumor whether metastatic disease, lymphoma or myeloma.  Although there may be minimal expansion of bony elements, no significant epidural spread of tumor is detected.  The CT detected cortical interruption of the L2 superior endplate is not as well appreciated on present examination.  Schmorl's node deformities T11-12, T12-L1, L1-2 new, L2-3 and L5-S1.  Bilateral renal cysts suspected. Limited imaging of the liver without mass identified.  Degenerative changes including:  T10-11:  Negative.  T11-12:  Negative.  T12-L1: Mild compression of the superior endplate with minimal posterior displacement  slightly greater to left with very mild spinal stenosis.  L1-2:  Negative.  L2-3: Moderate facet joint degenerative changes. 2 mm anterior slip of L2. Bulge. Ligamentum flavum hypertrophy. Multifactorial mild spinal stenosis with a trefoil appearance. Very mild bilateral foraminal narrowing.  L3-4: Moderate facet  joint degenerative changes. Ligamentum flavum hypertrophy. Minimal anterior slippage of L3. Bulge. Multifactorial mild spinal stenosis with a slightly trefoil appearance. Minimal bilateral foraminal narrowing.  L4-5: Moderate right-sided and mild left-sided facet joint degenerative changes. Ligamentum flavum hypertrophy. Mild bulge. Minimal right lateral recess stenosis. Very mild spinal stenosis.  L5-S1: Mild to moderate facet joint degenerative changes. Minimal bulge.  IMPRESSION: Diffuse altered signal intensity of all visualized bone marrow. Diffuse heterogeneous enhancement. Findings suspicious for tumor whether metastatic disease, lymphoma or myeloma.  Although there may be minimal expansion of bony elements, no significant epidural spread of tumor is detected.  The CT detected cortical interruption of the L2 superior endplate is not as well appreciated on present examination. L1 superior endplate Schmorl's node deformity/compression fracture with slight retropulsion minimally more notable to the left.  Schmorl's node deformities T11-12, T12-L1, L1-2 new, L2-3 and L5-S1.  L2-3 multifactorial mild spinal stenosis with a trefoil appearance. Very mild bilateral foraminal narrowing.  L3-4 multifactorial mild spinal stenosis with a slightly trefoil appearance. Minimal bilateral foraminal narrowing.  L4-5 multifactorial minimal right lateral recess stenosis. Very mild spinal stenosis.  L5-S1 Mild to moderate facet joint degenerative changes. Minimal bulge.   Electronically Signed   By: Chauncey Cruel M.D.   On: 01/25/2013 15:51    Assessment/Plan History of breast cancer. Back pain with new findings of lytic lesions of the spine. Request for image guided bone marrow biopsy. 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 Annamaria Boots regarding position of patient during procedure, he is ok with prone position for a minimal amount of time.  Patient has been NPO, last sq heparin  5000 units given 0600, Dr. Annamaria Boots aware. Risks and Benefits discussed with the patient and her family. All of the patient's questions were answered, patient is agreeable to proceed. Consent signed and in chart.   Tsosie Billing D PA-C 01/26/2013, 9:41 AM

## 2013-01-26 NOTE — Progress Notes (Signed)
UR completed 

## 2013-01-26 NOTE — Care Management Note (Unsigned)
    Page 1 of 1   01/26/2013     5:04:51 PM   CARE MANAGEMENT NOTE 01/26/2013  Patient:  Erin Branch, Erin Branch   Account Number:  [id number]  Date Initiated:  01/26/2013  Documentation initiated by:  Tomi Bamberger  Subjective/Objective Assessment:   dx back pain  admit- lives with spouse     Action/Plan:   Anticipated DC Date:  01/27/2013   Anticipated DC Plan:  [place]  CM consult      [facility] Choice  HOME HEALTH   Choice offered to / List presented to:             Status of service:  In process, will continue to follow Medicare Important Message given?   (If response is "NO", the following Medicare IM given date fields will be blank) Date Medicare IM given:   Date Additional Medicare IM given:    Discharge Disposition:    Per UR Regulation:  Reviewed for med. necessity/level of care/duration of stay  If discussed at [place] of Stay Meetings, dates discussed:    Comments:  01/26/13 17:03 Tomi Bamberger RN, BSN [phone] patient lives with spouse, Weekend NCM will follow up with HHPT and DME.

## 2013-01-26 NOTE — Progress Notes (Addendum)
Subjective: Patient feels as though her back pain is much more controlled today. She was able to sit up in bed for a few minutes as well as transfer beds by herself today, which is much improved from yesterday. The R sided abd pain has resolved today. Patient tells [place] she does not think she wants to pursue XRT or chemo, though we discussed that we will talk with oncology and the patient along with her family about this prior to making the final decision. Bone marrow biopsy was done this morning.   Objective: Vital signs in last 24 hours: Filed Vitals:   01/26/13 1036 01/26/13 1045 01/26/13 1113 01/26/13 1134  BP: 152/60 154/63 140/57 158/58  Pulse: 74     Temp:   98.7 F (37.1 C)   TempSrc:   Oral   Resp: $Remo'15 16 16 18  'Lvexi$ Height:      Weight:      SpO2: 99% 94% 89% 90%   Weight change: -1 lb 1.6 oz (-0.499 kg)  Intake/Output Summary (Last 24 hours) at 01/26/13 1228 Last data filed at 01/26/13 0734  Gross per 24 hour  Intake      0 ml  Output   1800 ml  Net  -1800 ml   Physical Exam:  General: lying flat and still, seems in better spirits today than yesterday; she had many questions HEENT: pupils equal round and reactive to light, vision grossly intact, oropharynx clear and non-erythematous  Neck: supple Lungs: clear to ascultation bilaterally anteriorly (did not sit patient up), normal work of respiration, no wheezes, rales, ronchi Heart: regular rate and rhythm, no murmurs, gallops, or rubs Abdomen: soft, non-tender, non-distended, normal bowel sounds; no TTP R flank Extremities: warm, no peripheral edema GU: foley in place Neurologic: alert & oriented X3, cranial nerves II-XII intact, strength grossly intact, sensation intact to light touch  Lab Results: Basic Metabolic Panel:  Recent Labs Lab 01/25/13 0522 01/26/13 0627  NA 142 142  K 3.9 4.5  CL 102 104  CO2 27 23  GLUCOSE 95 82  BUN 15 17  CREATININE 1.00 1.25*  CALCIUM 11.4* 11.3*   Liver Function  Tests:  Recent Labs Lab 01/25/13 0522  AST 24  ALT 11  ALKPHOS 137*  BILITOT 0.4  PROT 7.2  ALBUMIN 3.2*    Recent Labs Lab 01/25/13 0522  LIPASE 39   CBC:  Recent Labs Lab 01/25/13 0842  WBC 8.4  NEUTROABS 5.1  HGB 9.5*  HCT 29.4*  MCV 81.4  PLT 308   Cardiac Enzymes:  Recent Labs Lab 01/25/13 0538 01/25/13 1700 01/26/13 0033  TROPONINI <0.30 <0.30 <0.30   Coagulation:  Recent Labs Lab 01/25/13 1700  LABPROT 13.5  INR 1.05   Urinalysis:  Recent Labs Lab 01/25/13 0608  COLORURINE YELLOW  LABSPEC 1.006  PHURINE 7.5  GLUCOSEU NEGATIVE  HGBUR NEGATIVE  BILIRUBINUR NEGATIVE  KETONESUR NEGATIVE  PROTEINUR NEGATIVE  UROBILINOGEN 0.2  NITRITE NEGATIVE  LEUKOCYTESUR NEGATIVE   Studies/Results: Ct Abdomen Pelvis Wo Contrast  01/25/2013   CLINICAL DATA:  Abdominal pain.  Breast cancer.  EXAM: CT ABDOMEN AND PELVIS WITHOUT CONTRAST  TECHNIQUE: Multidetector CT imaging of the abdomen and pelvis was performed following the standard protocol without intravenous contrast.  COMPARISON:  None.  FINDINGS: Mild atelectasis in the lung bases.  Unenhanced images of the liver are negative. Gallbladder and bile ducts are normal. Pancreas and spleen are normal.  Negative for renal calculi. No renal obstruction or mass. Atherosclerotic aorta  without aneurysm.  Negative for bowel obstruction or bowel thickening. No adenopathy. Foley catheter in the bladder which appears normal.  Extensive and diffuse skeletal abnormality throughout the spine and pelvis with multiple lytic lesions as well as sclerotic lesions. Findings are compatible with diffuse tumor. This may represent myeloma or metastatic disease. I would favor multiple myeloma. Disc degeneration with anterior slip at L2-3, L3-4, and L4-5. Mild fracture the L2 superior endplate.  IMPRESSION: No acute intra-abdominal abnormality.  Extensive and diffuse skeletal abnormality compatible with widespread myeloma or metastatic  disease. Mild fracture of L2.   Electronically Signed   By: Franchot Gallo M.D.   On: 01/25/2013 08:24   Mr Lumbar Spine W Wo Contrast  01/25/2013   CLINICAL DATA:  History of breast cancer.  Abnormal CT.  EXAM: MRI LUMBAR SPINE WITHOUT AND WITH CONTRAST  TECHNIQUE: Multiplanar and multiecho pulse sequences of the lumbar spine were obtained without and with intravenous contrast.  CONTRAST:  50mL MULTIHANCE GADOBENATE DIMEGLUMINE 529 MG/ML IV SOLN  COMPARISON:  01/25/2013 CT.  FINDINGS: Last fully open disk space is labeled L5-S1. Present examination incorporates from T10-11 disc space through the S2 level.  Conus L1-2 level. No abnormal enhancement of the distal cord, conus or nerve roots.  Diffuse altered signal intensity of all visualized bone marrow. Diffuse heterogeneous enhancement. Findings suspicious for tumor whether metastatic disease, lymphoma or myeloma.  Although there may be minimal expansion of bony elements, no significant epidural spread of tumor is detected.  The CT detected cortical interruption of the L2 superior endplate is not as well appreciated on present examination.  Schmorl's node deformities T11-12, T12-L1, L1-2 new, L2-3 and L5-S1.  Bilateral renal cysts suspected. Limited imaging of the liver without mass identified.  Degenerative changes including:  T10-11:  Negative.  T11-12:  Negative.  T12-L1: Mild compression of the superior endplate with minimal posterior displacement slightly greater to left with very mild spinal stenosis.  L1-2:  Negative.  L2-3: Moderate facet joint degenerative changes. 2 mm anterior slip of L2. Bulge. Ligamentum flavum hypertrophy. Multifactorial mild spinal stenosis with a trefoil appearance. Very mild bilateral foraminal narrowing.  L3-4: Moderate facet joint degenerative changes. Ligamentum flavum hypertrophy. Minimal anterior slippage of L3. Bulge. Multifactorial mild spinal stenosis with a slightly trefoil appearance. Minimal bilateral foraminal  narrowing.  L4-5: Moderate right-sided and mild left-sided facet joint degenerative changes. Ligamentum flavum hypertrophy. Mild bulge. Minimal right lateral recess stenosis. Very mild spinal stenosis.  L5-S1: Mild to moderate facet joint degenerative changes. Minimal bulge.  IMPRESSION: Diffuse altered signal intensity of all visualized bone marrow. Diffuse heterogeneous enhancement. Findings suspicious for tumor whether metastatic disease, lymphoma or myeloma.  Although there may be minimal expansion of bony elements, no significant epidural spread of tumor is detected.  The CT detected cortical interruption of the L2 superior endplate is not as well appreciated on present examination. L1 superior endplate Schmorl's node deformity/compression fracture with slight retropulsion minimally more notable to the left.  Schmorl's node deformities T11-12, T12-L1, L1-2 new, L2-3 and L5-S1.  L2-3 multifactorial mild spinal stenosis with a trefoil appearance. Very mild bilateral foraminal narrowing.  L3-4 multifactorial mild spinal stenosis with a slightly trefoil appearance. Minimal bilateral foraminal narrowing.  L4-5 multifactorial minimal right lateral recess stenosis. Very mild spinal stenosis.  L5-S1 Mild to moderate facet joint degenerative changes. Minimal bulge.   Electronically Signed   By: Chauncey Cruel M.D.   On: 01/25/2013 15:51   Ct Biopsy  01/26/2013   CLINICAL DATA:  BREAST CANCER, ABNORMAL BONE MARROW BY MRI CONCERNING FOR INFILTRATIVE METASTATIC PROCESS VERSUS MULTIPLE MYELOMA  EXAM: CT GUIDED RIGHT ILIAC BONE MARROW ASPIRATION AND CORE BIOPSY  Date:  1/9/20151/09/2013 9:49 AM  Radiologist:  Jerilynn Mages. Daryll Brod, MD  Guidance:  CT  MEDICATIONS AND MEDICAL HISTORY: VERSED AND FENTANYL FOR CONSCIOUS SEDATION  ANESTHESIA/SEDATION: 15 MIN  CONTRAST:  NONE.  PROCEDURE: Informed consent was obtained from the patient following explanation of the procedure, risks, benefits and alternatives. The patient understands,  agrees and consents for the procedure. All questions were addressed. A time out was performed.  The patient was positioned prone and noncontrast localization CT was performed of the pelvis to demonstrate the iliac marrow spaces.  Maximal barrier sterile technique utilized including caps, mask, sterile gowns, sterile gloves, large sterile drape, hand hygiene, and betadine prep.  Under sterile conditions and local anesthesia, an 11 gauge coaxial bone biopsy needle was advanced into the RIGHT iliac marrow space. Needle position was confirmed with CT imaging. Initially, bone marrow aspiration was attempted however no aspirate could be obtained, presumed secondary to the infiltrative marrow process. Next, the 11 gauge outer cannula was utilized to obtain a 2 right iliac bone marrow core biopsies. Needle was removed. Hemostasis was obtained with compression. The patient tolerated the procedure well. Samples were prepared with the cytotechnologist. No immediate complications.  COMPLICATIONS: NO IMMEDIATE  IMPRESSION: CT guided right iliac bone marrow core biopsy.   Electronically Signed   By: Daryll Brod M.D.   On: 01/26/2013 10:50   Medications: I have reviewed the patient's current medications. Scheduled Meds: . aspirin EC  243 mg Oral Daily  . aspirin  243 mg Oral Daily  . fentaNYL      . heparin  5,000 Units Subcutaneous Q8H  . irbesartan  150 mg Oral Daily  . lidocaine      . midazolam      . OxyCODONE  15 mg Oral Q12H  . sodium chloride  3 mL Intravenous Q12H   Continuous Infusions: . sodium chloride 75 mL/hr at 01/25/13 1700   PRN Meds:.acetaminophen, acetaminophen, morphine injection, ondansetron (ZOFRAN) IV, ondansetron, oxyCODONE  Assessment/Plan: Patient is a 80yo WF with past medical history of CVA, HTN, HLD, stage 1 breast cancer (S/P partial mastectomy 2006), who presents with severe back pain and urinary incontinence, found to have extensive lytic lesions to pelvis and spine, c/w  multiple myeloma or metastatic disease per both CT/MRI.  # Back pain- multiple myeloma vs metastatic disease vs lymphoma: Pain more well controlled today. MRI done yesterday confirmed what the CT already showed, no spinal cord compression. Repeat Ca 11.3. Bone marrow biopsy was done this morning. I spoke with Dr. Chancy Milroy today, who thinks patients work up can be pursued in the outpatient setting. There is no reason to wait for biopsy results. Dr. Chancy Milroy agrees with pan to start zometa if patient is agreeable ($RemoveBefor'4mg'JtELrsTPsAPb$  IV once q3-4 weeks). Patient requesting to wait until tomorrow to start this as she has received "a lot of medications today."  We will need to assess patient's mobility and function, so we will as PT/OT to evaluate patient today. -PT/OT consult -f/u Bone marrow biopsy -start zometa $RemoveBefor'4mg'KizqYXpGlbNN$  IV once every 3-4 weeks tomorrow -pain control: MS Contin 15 mg twice a day, oxycodone 5 mg when necessary, Tylenol   -zofran for nausea  -oncology is following, appreciate recs -may need to initiate palliative conversation soon   # Hypertension: BP is stable, 139/68. She is on Diovan 160  mg daily at home.  - Avapro (Irbesratan) 150 mg daily while in hospital.   # Hyperlipidemia: LDL was 166 on 06/14/12. Not on medications at home.  -consider starting statin as outpt   # hx of Breast cancer: s/p of lumpectomy. She has not been followed up by oncologist until 2009. In 2006, patient had refused XRT and chemo despite MD recommendations. Her last mammogram was 4 years ago, which was normal. It is possible that her current CT/MRI findings represent metastatic breast cancer. -oncology consult   # hx of stroke: Diagnosed in 5/204. No significant neurologic deficit, except for poor balance.  -will continue Home ASA   #: abnormal EKG: Patient has T wave inversion in V2 through V4, which is new. Patient did not have chest pain on admission and continues to be without chest pain. Troponin neg x 3.  # F/E/N  -NS: d/c  IVF -Diet: regular   # DVT px: Heparin sq  Dispo: Discharge tomorrow likely pending what PT/OT recs are as well as good pain control  The patient does have a current PCP Nicoletta Dress, MD) and does not need an [facility] hospital follow-up appointment after discharge.  The patient does not have transportation limitations that hinder transportation to clinic appointments.  .Services Needed at time of discharge: Y = Yes, Blank = No PT:   OT:   RN:   Equipment:   Other:     LOS: 1 day   Rebecca Eaton, MD 01/26/2013, 12:28 PM

## 2013-01-26 NOTE — Evaluation (Signed)
Physical Therapy Evaluation Patient Details Name: Erin Branch MRN: [id number] DOB: 05/03/1933 Today's Date: 01/26/2013 Time: 8841-6606 PT Time Calculation (min): 20 min  PT Assessment / Plan / Recommendation History of Present Illness  Erin Branch is an 80 y.o. female with PMHx of breast cancer who now presents with c/o lower back pain and right sided rib pain. She denies any current right hip pain or skin changes. She denies any previous right hip surgeries. She had imaging which revealed lytic lesions of the spine and pelvis. Dr. Humphrey Rolls is requesting an image guided bone marrow biopsy. She denies any chest pain or shortness of breath. She denies any active bleeding or history of clotting disorders. She denies any fever or chills. She denies any known complications to conscious sedation. She denies any history of sleep apnea or chronic home oxygen use. She does have a history of CVA and is concerned about laying on her stomach for the procedure with decrease in blood flow to her brain with certain head positions, I will discuss this with Dr. Annamaria Boots.  Clinical Impression  Pt presents today with decreased balance, mobility and safety awareness.  Performed bathing/dressing at EOB this afternoon with supervision for safety.  Also performed gait in hallway with RW at min assist level.  Feel pt is very confident with RW and she also states she would like one at home.  Feel she would also benefit from tub transfer bench if covered.  Case manager, please address with family.  PT recommends HHPT for follow up at D/C.                        PT Assessment  Patient needs continued PT services    Follow Up Recommendations  Home health PT    Does the patient have the potential to tolerate intense rehabilitation      Barriers to Discharge        Equipment Recommendations  Rolling walker with 5" wheels;Other (comment) (tub transfer bench)    Recommendations for Other Services     Frequency Min 3X/week    Precautions / Restrictions Precautions Precautions: Fall Precaution Comments: back pain from mets Restrictions Weight Bearing Restrictions: No   Pertinent Vitals/Pain Pt with mild to moderate pain in back.       Mobility  Bed Mobility Overal bed mobility: Needs Assistance Bed Mobility: Rolling;Supine to Sit Rolling: Min guard Supine to sit: Min assist General bed mobility comments: Provided cues for log rolling to decrease pain and increase safety due to possible spine mets.  hand over hand cues for reaching for handrail and some assist to elevate trunk safely.  Transfers Overall transfer level: Needs assistance Equipment used: None;Rolling walker (2 wheeled) Transfers: Sit to/from [facility] Sit to Stand: Min assist Stand pivot transfers: Min assist General transfer comment: Pt initially transferred bed to bedside commode without AD at min/mod assist for steadying and safety.  note pt impusive to move with max cues from therapist and family to wait for therapist.   Ambulation/Gait Ambulation/Gait assistance: Min assist;Min guard Ambulation Distance (Feet): 100 Feet Assistive device: Rolling walker (2 wheeled) Gait Pattern/deviations: Step-through pattern;Decreased stride length Gait velocity: decreased    Exercises     PT Diagnosis: Difficulty walking;Generalized weakness;Acute pain  PT Problem List: Decreased strength;Decreased activity tolerance;Decreased balance;Decreased mobility;Decreased knowledge of use of DME;Decreased safety awareness PT Treatment Interventions: DME instruction;Gait training;Stair training;Functional mobility training;Therapeutic activities;Therapeutic exercise;Balance training;Patient/family education     PT Goals(Current  goals can be found in the care plan section) Acute Rehab PT Goals Patient Stated Goal: to return home with family PT Goal Formulation: With patient/family Time For Goal Achievement: 02/02/13 Potential to  Achieve Goals: Good  Visit Information  Last PT Received On: 01/26/13 Assistance Needed: +1 History of Present Illness: Erin Branch is an 80 y.o. female with PMHx of breast cancer who now presents with c/o lower back pain and right sided rib pain. She denies any current right hip pain or skin changes. She denies any previous right hip surgeries. She had imaging which revealed lytic lesions of the spine and pelvis. Dr. Humphrey Rolls is requesting an image guided bone marrow biopsy. She denies any chest pain or shortness of breath. She denies any active bleeding or history of clotting disorders. She denies any fever or chills. She denies any known complications to conscious sedation. She denies any history of sleep apnea or chronic home oxygen use. She does have a history of CVA and is concerned about laying on her stomach for the procedure with decrease in blood flow to her brain with certain head positions, I will discuss this with Dr. Annamaria Boots.       Prior [place] expects to be discharged to:: Private residence Living Arrangements: Children Available Help at Discharge: Family;Available PRN/intermittently Type of Home: House Home Access: Stairs to enter [employer] of Steps: 1 Entrance Stairs-Rails: None Home Layout: Two level;Able to live on main level with bedroom/bathroom Home Equipment: Cane - single point Prior Function Level of Independence: Independent with assistive device(s) Comments: using cane past few days, has tub/shower unit, elevated commode seat Communication Communication: No difficulties    Cognition  Cognition Arousal/Alertness: Awake/alert Behavior During Therapy: WFL for tasks assessed/performed Overall Cognitive Status: Within Functional Limits for tasks assessed    Extremity/Trunk Assessment Lower Extremity Assessment Lower Extremity Assessment: Generalized weakness Cervical / Trunk Assessment Cervical / Trunk Assessment:  Normal   Balance    End of Session PT - End of Session Equipment Utilized During Treatment: Gait belt Activity Tolerance: Patient tolerated treatment well;Patient limited by pain Patient left: in chair;with call bell/phone within reach;with family/visitor present Nurse Communication: Mobility status  GP Functional Assessment Tool Used: clinical judgement Functional Limitation: Mobility: Walking and moving around Mobility: Walking and Moving Around Current Status [phone]): At least 1 percent but less than 20 percent impaired, limited or restricted Mobility: Walking and Moving Around Goal Status [phone]): At least 1 percent but less than 20 percent impaired, limited or restricted   Denice Bors 01/26/2013, 4:41 PM

## 2013-01-26 NOTE — Progress Notes (Signed)
INITIAL NUTRITION ASSESSMENT  DOCUMENTATION CODES Per approved criteria  -Not Applicable   INTERVENTION:  Diet advancement as able post procedure per MD. Recommend regular diet with Ensure Complete PO TID.  NUTRITION DIAGNOSIS: Inadequate oral intake related to inability to eat as evidenced by NPO status.   Goal: Intake to meet >90% of estimated nutrition needs.  Monitor:  Diet advancement, PO intake, labs, weight trend.  Reason for Assessment: MST=2  80 y.o. female  Admitting Dx: Back pain  ASSESSMENT: Patient is a 80 year old lady with past medical history of stroke, hypertension, hyperlipidemia, breast cancer (S/P partial mastectomy 2006), no chemo or radiation per patient preference, who presents with severe back pain and urinary incontinence. CT scans revealed multiple lytic lesions in spine and pelvis.   Oncologist is following for evaluation for possible metastatic breast cancer vs myeloma or lymphoma. Patient currently in interventional radiology per discussion with RN. Unable to speak with patient or complete nutrition focused physical exam at this time. Patient is at nutrition risk given recent 9% weight loss in the past 6 months.    Height: Ht Readings from Last 1 Encounters:  01/25/13 5\' 3"  (1.6 m)    Weight: Wt Readings from Last 1 Encounters:  01/26/13 125 lb 10.6 oz (57 kg)    Ideal Body Weight: 52.3 kg  % Ideal Body Weight: 109%  Wt Readings from Last 10 Encounters:  01/26/13 125 lb 10.6 oz (57 kg)  12/22/12 130 lb (58.968 kg)  08/22/12 138 lb (62.596 kg)  06/26/12 139 lb 1.6 oz (63.095 kg)  06/17/12 125 lb (56.7 kg)    Usual Body Weight: 138 lb (5 months ago)  % Usual Body Weight: 91%  BMI:  Body mass index is 22.27 kg/(m^2).  Estimated Nutritional Needs: Kcal: 1500-1700 Protein: 75-90 gm Fluid: 1.5-1.7 L  Skin: no wounds  Diet Order: NPO  EDUCATION NEEDS: -Education not appropriate at this time   Intake/Output Summary (Last 24  hours) at 01/26/13 0913 Last data filed at 01/26/13 0734  Gross per 24 hour  Intake      0 ml  Output   1800 ml  Net  -1800 ml    Last BM: 1/5   Labs:   Recent Labs Lab 01/25/13 0522 01/26/13 0627  NA 142 142  K 3.9 4.5  CL 102 104  CO2 27 23  BUN 15 17  CREATININE 1.00 1.25*  CALCIUM 11.4* 11.3*  GLUCOSE 95 82    CBG (last 3)  No results found for this basename: GLUCAP,  in the last 72 hours  Scheduled Meds: . aspirin EC  243 mg Oral Daily  . aspirin  243 mg Oral Daily  . heparin  5,000 Units Subcutaneous Q8H  . irbesartan  150 mg Oral Daily  . OxyCODONE  15 mg Oral Q12H  . sodium chloride  3 mL Intravenous Q12H    Continuous Infusions: . sodium chloride 75 mL/hr at 01/25/13 1700    Past Medical History  Diagnosis Date  . Hypertension   . 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 Anxiety   . GERD (gastroesophageal reflux disease)   . Carcinoma of breast, stage 1 2006    s/p Right partial mastectomy  . Arthritis     Past Surgical History  Procedure Laterality Date  . Partial mastectomy Right 04/2004    Rudell Cobb. Annamaria Boots, M.D.   . Breast surgery    . Appendectomy    . Ankle fracture surgery  Left [place], RD, LDN, [place] Pager [phone] After Hours Pager [phone]

## 2013-01-27 LAB — CBC
HCT: 27.1 % — ABNORMAL LOW (ref 36.0–46.0)
Hemoglobin: 8.7 g/dL — ABNORMAL LOW (ref 12.0–15.0)
MCH: 26.2 pg (ref 26.0–34.0)
MCHC: 32.1 g/dL (ref 30.0–36.0)
MCV: 81.6 fL (ref 78.0–100.0)
Platelets: 286 10*3/uL (ref 150–400)
RBC: 3.32 MIL/uL — ABNORMAL LOW (ref 3.87–5.11)
RDW: 15.2 % (ref 11.5–15.5)
WBC: 7.4 10*3/uL (ref 4.0–10.5)

## 2013-01-27 MED ORDER — DOCUSATE SODIUM 100 MG PO CAPS
100.0000 mg | ORAL_CAPSULE | Freq: Every day | ORAL | Status: DC
  Administered 2013-01-27 – 2013-01-28 (×2): 100 mg via ORAL
  Filled 2013-01-27 (×3): qty 1

## 2013-01-27 MED ORDER — POLYETHYLENE GLYCOL 3350 17 G PO PACK
17.0000 g | PACK | Freq: Every day | ORAL | Status: DC | PRN
  Filled 2013-01-27: qty 1

## 2013-01-27 MED ORDER — SODIUM CHLORIDE 0.9 % IV SOLN
INTRAVENOUS | Status: AC
  Administered 2013-01-27: 19:00:00 via INTRAVENOUS

## 2013-01-27 MED ORDER — AMLODIPINE BESYLATE 5 MG PO TABS
5.0000 mg | ORAL_TABLET | Freq: Every day | ORAL | Status: DC
  Filled 2013-01-27: qty 1

## 2013-01-27 MED ORDER — ENSURE COMPLETE PO LIQD
237.0000 mL | Freq: Two times a day (BID) | ORAL | Status: DC
  Administered 2013-01-28 (×2): 237 mL via ORAL

## 2013-01-27 NOTE — Progress Notes (Signed)
Occupational Therapy Evaluation Patient Details Name: Erin Branch MRN: [id number] DOB: May 09, 1933 Today's Date: 01/27/2013 Time: 5176-1607 OT Time Calculation (min): 23 min  OT Assessment / Plan / Recommendation History of present illness Erin Branch is an 80 y.o. female with PMHx of breast cancer who now presents with c/o lower back pain and right sided rib pain. She denies any current right hip pain or skin changes. She denies any previous right hip surgeries. She had imaging which revealed lytic lesions of the spine and pelvis. Dr. Humphrey Rolls is requesting an image guided bone marrow biopsy. She denies any chest pain or shortness of breath. She denies any active bleeding or history of clotting disorders. She denies any fever or chills. She denies any known complications to conscious sedation. She denies any history of sleep apnea or chronic home oxygen use. She does have a history of CVA and is concerned about laying on her stomach for the procedure with decrease in blood flow to her brain with certain head positions, I will discuss this with Dr. Annamaria Boots.   Clinical Impression   Patient doing better today, pain better controlled.    OT Assessment  Patient needs continued OT Services    Follow Up Recommendations  Supervision/Assistance - 24 hour;No OT follow up    Barriers to Discharge      Equipment Recommendations  None recommended by OT    Recommendations for Other Services    Frequency  Min 2X/week    Precautions / Restrictions Precautions Precautions: Fall Precaution Comments: back pain from mets Restrictions Weight Bearing Restrictions: No   Pertinent Vitals/Pain C/o rib soreness    ADL  Eating/Feeding: Independent Where Assessed - Eating/Feeding: Bed level Grooming: Simulated;Set up Where Assessed - Grooming: Unsupported sitting Upper Body Bathing: Simulated;Set up Where Assessed - Upper Body Bathing: Unsupported sitting Lower Body Bathing: Simulated;Minimal  assistance Where Assessed - Lower Body Bathing: Supported sit to stand Upper Body Dressing: Performed;Set up Where Assessed - Upper Body Dressing: Unsupported sitting Lower Body Dressing: Simulated;Minimal assistance Where Assessed - Lower Body Dressing: Supported sit to stand Toilet Transfer: Performed;Minimal assistance Toilet Transfer Method: Sit to stand;Stand pivot Science writer: Comfort height toilet Toileting - Clothing Manipulation and Hygiene: Simulated;Min guard Where Assessed - Toileting Clothing Manipulation and Hygiene: Sit on 3-in-1 or toilet Transfers/Ambulation Related to ADLs: Min guard A with ambulation with RW in room. Patient requested to amb in hallway, ambulated 175 ft total. ADL Comments: Pt attempted toileting without success. Toilet transfer min A.    OT Diagnosis: Generalized weakness;Acute pain  OT Problem List: Decreased strength;Decreased activity tolerance;Impaired balance (sitting and/or standing);Decreased knowledge of use of DME or AE;Pain OT Treatment Interventions: Self-care/ADL training;DME and/or AE instruction;Therapeutic exercise;Therapeutic activities;Patient/family education   OT Goals(Current goals can be found in the care plan section) Acute Rehab OT Goals Patient Stated Goal: to return home with family OT Goal Formulation: With patient Time For Goal Achievement: 02/10/13 Potential to Achieve Goals: Good  Visit Information  Last OT Received On: 01/27/13 Assistance Needed: +1 History of Present Illness: Erin Branch is an 80 y.o. female with PMHx of breast cancer who now presents with c/o lower back pain and right sided rib pain. She denies any current right hip pain or skin changes. She denies any previous right hip surgeries. She had imaging which revealed lytic lesions of the spine and pelvis. Dr. Humphrey Rolls is requesting an image guided bone marrow biopsy. She denies any chest pain or shortness of breath. She denies  any active bleeding  or history of clotting disorders. She denies any fever or chills. She denies any known complications to conscious sedation. She denies any history of sleep apnea or chronic home oxygen use. She does have a history of CVA and is concerned about laying on her stomach for the procedure with decrease in blood flow to her brain with certain head positions, I will discuss this with Dr. Annamaria Boots.       Prior [place] expects to be discharged to:: Private residence Living Arrangements: Children Available Help at Discharge: Family;Available PRN/intermittently Type of Home: House Home Access: Stairs to enter [employer] of Steps: 1 Entrance Stairs-Rails: None Home Layout: Two level;Able to live on main level with bedroom/bathroom Home Equipment: Cane - single point Prior Function Level of Independence: Independent with assistive device(s) Comments: using cane past few days, has tub/shower unit, elevated commode seat Communication Communication: No difficulties Dominant Hand: Right         Vision/Perception Vision - History Baseline Vision: No visual deficits   Cognition  Cognition Arousal/Alertness: Awake/alert Behavior During Therapy: WFL for tasks assessed/performed Overall Cognitive Status: Within Functional Limits for tasks assessed    Extremity/Trunk Assessment Upper Extremity Assessment Upper Extremity Assessment: Overall WFL for tasks assessed Lower Extremity Assessment Lower Extremity Assessment: Overall WFL for tasks assessed Cervical / Trunk Assessment Cervical / Trunk Assessment: Normal     Mobility       Exercise     Balance     End of Session OT - End of Session Equipment Utilized During Treatment: Gait belt;Rolling walker Activity Tolerance: Patient tolerated treatment well Patient left: in chair;with call bell/phone within reach;with family/visitor present Nurse Communication: Mobility status  GO Functional  Limitation: Self care Self Care Current Status (A8341): At least 20 percent but less than 40 percent impaired, limited or restricted Self Care Goal Status (D6222): At least 1 percent but less than 20 percent impaired, limited or restricted   Mackinley Kiehn A 01/27/2013, 11:46 AM

## 2013-01-27 NOTE — Plan of Care (Signed)
Problem: Phase I Progression Outcomes Goal: Initial discharge plan identified Outcome: Completed/Met Date Met:  01/27/13 To return home with family

## 2013-01-27 NOTE — Progress Notes (Addendum)
Subjective: Ms. Schlauch was seen and examined while in the chair this morning with her son in the room as well along with her ex son-in-law.  She reports lots of pain this morning but now improved after pain medicine.  She has been able to walk in the hallway a little and is able to sit in the chair and tolerating well.  Her family was concerned about pain medication and sedation, however, Ms. Lefevre reports feeling comfortable with her pain management at this time and requests no changes.  She did have an episode of vomiting yesterday and some nausea with decreased appetite. They will follow up with oncology as an outpatient and the family had a list of questions that we discussed and I answered.  We will work on having her walk today, try to void without foley, and work on bowel regimen since she has not had a BM yet.   Objective: Vital signs in last 24 hours: Filed Vitals:   01/26/13 2149 01/27/13 0500 01/27/13 0636 01/27/13 1340  BP: 157/71  155/65 165/65  Pulse: 69  62 61  Temp: 98.4 F (36.9 C)  98.5 F (36.9 C) 96.3 F (35.7 C)  TempSrc: Oral  Oral Axillary  Resp: _0 Height:      Weight:  125 lb 14.1 oz (57.1 kg)    SpO2: 90%  92% 90%   Weight change: 1 lb 15.7 oz (0.899 kg)  Intake/Output Summary (Last 24 hours) at 01/27/13 1626 Last data filed at 01/26/13 2152  Gross per 24 hour  Intake      0 ml  Output    750 ml  Net   -750 ml   Physical Exam:  General: sitting in chair, NAD HEENT: EOMI Lungs: CTA B/L Heart: RRR Abdomen: soft, NT, ND, +bs  Extremities: warm, no peripheral edema, moving all 4 extremities. Reports pain in her back with elevation of lower extremities GU: foley in place Neurologic: alert & oriented X3, cranial nerves II-XII intact, strength grossly intact, sensation intact to light touch  Lab Results: Basic Metabolic Panel:  Recent Labs Lab 01/25/13 0522 01/26/13 0627  NA 142 142  K 3.9 4.5  CL 102 104  CO2 27 23  GLUCOSE 95 82  BUN  15 17  CREATININE 1.00 1.25*  CALCIUM 11.4* 11.3*   Liver Function Tests:  Recent Labs Lab 01/25/13 0522  AST 24  ALT 11  ALKPHOS 137*  BILITOT 0.4  PROT 7.2  ALBUMIN 3.2*    Recent Labs Lab 01/25/13 0522  LIPASE 39   CBC:  Recent Labs Lab 01/25/13 0842 01/27/13 0345  WBC 8.4 7.4  NEUTROABS 5.1  --   HGB 9.5* 8.7*  HCT 29.4* 27.1*  MCV 81.4 81.6  PLT 308 286   Cardiac Enzymes:  Recent Labs Lab 01/25/13 0538 01/25/13 1700 01/26/13 0033  TROPONINI <0.30 <0.30 <0.30   Coagulation:  Recent Labs Lab 01/25/13 1700  LABPROT 13.5  INR 1.05   Urinalysis:  Recent Labs Lab 01/25/13 0608  COLORURINE YELLOW  LABSPEC 1.006  PHURINE 7.5  GLUCOSEU NEGATIVE  HGBUR NEGATIVE  BILIRUBINUR NEGATIVE  KETONESUR NEGATIVE  PROTEINUR NEGATIVE  UROBILINOGEN 0.2  NITRITE NEGATIVE  LEUKOCYTESUR NEGATIVE   Studies/Results: Ct Biopsy  01/26/2013   CLINICAL DATA:  BREAST CANCER, ABNORMAL BONE MARROW BY MRI CONCERNING FOR INFILTRATIVE METASTATIC PROCESS VERSUS MULTIPLE MYELOMA  EXAM: CT GUIDED RIGHT ILIAC BONE MARROW ASPIRATION AND CORE BIOPSY  Date:  1/9/20151/09/2013 9:49 AM  Radiologist:  Jerilynn Mages. Daryll Brod, MD  Guidance:  CT  MEDICATIONS AND MEDICAL HISTORY: VERSED AND FENTANYL FOR CONSCIOUS SEDATION  ANESTHESIA/SEDATION: 15 MIN  CONTRAST:  NONE.  PROCEDURE: Informed consent was obtained from the patient following explanation of the procedure, risks, benefits and alternatives. The patient understands, agrees and consents for the procedure. All questions were addressed. A time out was performed.  The patient was positioned prone and noncontrast localization CT was performed of the pelvis to demonstrate the iliac marrow spaces.  Maximal barrier sterile technique utilized including caps, mask, sterile gowns, sterile gloves, large sterile drape, hand hygiene, and betadine prep.  Under sterile conditions and local anesthesia, an 11 gauge coaxial bone biopsy needle was advanced into  the RIGHT iliac marrow space. Needle position was confirmed with CT imaging. Initially, bone marrow aspiration was attempted however no aspirate could be obtained, presumed secondary to the infiltrative marrow process. Next, the 11 gauge outer cannula was utilized to obtain a 2 right iliac bone marrow core biopsies. Needle was removed. Hemostasis was obtained with compression. The patient tolerated the procedure well. Samples were prepared with the cytotechnologist. No immediate complications.  COMPLICATIONS: NO IMMEDIATE  IMPRESSION: CT guided right iliac bone marrow core biopsy.   Electronically Signed   By: Daryll Brod M.D.   On: 01/26/2013 10:50   Medications: I have reviewed the patient's current medications. Scheduled Meds: . amLODipine  5 mg Oral Daily  . aspirin  243 mg Oral Daily  . docusate sodium  100 mg Oral Daily  . [START ON 01/28/2013] feeding supplement (ENSURE COMPLETE)  237 mL Oral BID BM  . heparin  5,000 Units Subcutaneous Q8H  . irbesartan  150 mg Oral Daily  . OxyCODONE  15 mg Oral Q12H  . sodium chloride  3 mL Intravenous Q12H   Continuous Infusions:   PRN Meds:.acetaminophen, acetaminophen, morphine injection, ondansetron (ZOFRAN) IV, ondansetron, oxyCODONE, polyethylene glycol  Assessment/Plan: Patient is a 80yo WF with past medical history of CVA, HTN, HLD, stage 1 breast cancer (S/P partial mastectomy 2006), admitted for severe back pain and found to have extensive lytic lesions to pelvis and spine, c/w multiple myeloma or metastatic disease on imaging.   Back pain 2/2 ?multiple myeloma vs metastatic disease (hx of breast cancer s/p lumpectomy) vs lymphoma: ensuring pain control but does occasionally get sedated at night per family that they were concerned with. Will follow up with oncology as outpatient. Given hypercalcemia, bisphosphonate therapy was discussed and she wishes to try an infusion while inpatient. Per Dr. Chancy Milroy, recommends Zometa, however not available in  hospital but Pamidronate is available.  We did discuss possible palliative care assistance as outpatient to assist both Ms. Vo and her family once they know more about the current status of her disease.  She is hesitant to start chemotherapy or radiation and will continue the discussion with oncology.   -PT/OT consult: home health, no OT, 24 hour supervision.  Rolling walker and tub transfer bench -f/u Bone marrow biopsy, done 01/26/13 -after discussing side effects of pamidronate (alternative to Zometa as that is the only one available in the hospital) Ms. Lampi has decided not to proceed with the infusion at this time but will discuss with oncology and pcp as outpatient and possibly start then.  -pain control: MS Contin 15 mg twice a day, oxycodone 5 mg prn.  -zofran for nausea prn  -miralax and colace prn -oncology follow up as outpatient, was seen by Dr. Chancy Milroy in patient -will d/c  foley and see if she is able to void with either bedside commode, bed pan, or toilet.   Hypertension: elevated likely in setting of pain and we were holding her CCB.  - continue Avapro and resume home dose norvasc  Hyperlipidemia: LDL was 166 on 06/14/12. Not on medications at home.  -consider starting statin as outpt   Hx of stroke: Diagnosed in 5/204. No significant neurologic deficit, except for poor balance.  -will continue Home ASA, BP control. Recommend statin as outpatient if tolerating extra medications.   Diet: regular but will add ensure supplements and consider nutrition consult  DVT px: Heparin sq Dispo: possible d/c tomorrow   The patient does have a current PCP Nicoletta Dress, MD) and does not need an [facility] hospital follow-up appointment after discharge.  The patient does not have transportation limitations that hinder transportation to clinic appointments.  Services Needed at time of discharge: Y = Yes, Blank = No PT: Home health  OT:   RN:   Equipment: rolling walker, tub transfer bench    Other:     LOS: 2 days   Jerene Pitch, MD 01/27/2013, 4:26 PM

## 2013-01-28 DIAGNOSIS — R112 Nausea with vomiting, unspecified: Secondary | ICD-10-CM | POA: Diagnosis present

## 2013-01-28 MED ORDER — HYDROCHLOROTHIAZIDE 25 MG PO TABS: 25.0000 mg | ORAL_TABLET | Freq: Every day | ORAL | Status: AC

## 2013-01-28 MED ORDER — OXYCODONE HCL 5 MG PO TABS: 5.0000 mg | ORAL_TABLET | ORAL | Status: AC | PRN

## 2013-01-28 MED ORDER — ENSURE COMPLETE PO LIQD: 237.0000 mL | Freq: Three times a day (TID) | ORAL | Status: AC

## 2013-01-28 MED ORDER — OXYCODONE HCL ER 15 MG PO T12A: 15.0000 mg | EXTENDED_RELEASE_TABLET | Freq: Two times a day (BID) | ORAL | Status: AC

## 2013-01-28 MED ORDER — AMLODIPINE BESYLATE 5 MG PO TABS: 5.0000 mg | ORAL_TABLET | Freq: Every day | ORAL | Status: AC

## 2013-01-28 MED ORDER — POLYETHYLENE GLYCOL 3350 17 G PO PACK: 17.0000 g | PACK | Freq: Every day | ORAL | Status: AC | PRN

## 2013-01-28 MED ORDER — ONDANSETRON HCL 4 MG PO TABS: 4.0000 mg | ORAL_TABLET | Freq: Four times a day (QID) | ORAL | Status: AC | PRN

## 2013-01-28 MED ORDER — DSS 100 MG PO CAPS: 100.0000 mg | ORAL_CAPSULE | Freq: Every day | ORAL | Status: AC | PRN

## 2013-01-28 MED ORDER — ASPIRIN EC 81 MG PO TBEC
243.0000 mg | DELAYED_RELEASE_TABLET | Freq: Every day | ORAL | Status: DC
  Administered 2013-01-28: 243 mg via ORAL

## 2013-01-28 NOTE — Progress Notes (Addendum)
   CARE MANAGEMENT NOTE 01/28/2013  Patient:  SHAWNITA, KRIZEK   Account Number:  [id number]  Date Initiated:  01/26/2013  Documentation initiated by:  Tomi Bamberger  Subjective/Objective Assessment:   dx back pain  admit- lives with spouse     Action/Plan:   discharge planning   Anticipated DC Date:  01/27/2013   Anticipated DC Plan:  [place]  CM consult      [facility] Choice  HOME HEALTH   Choice offered to / List presented to:  C-1 Patient      DME agency  [place] arranged  [place] agency  [place]   Status of service:  Completed, signed off Medicare Important Message given?   (If response is "NO", the following Medicare IM given date fields will be blank) Date Medicare IM given:   Date Additional Medicare IM given:    Discharge Disposition:  [place]  Per UR Regulation:  Reviewed for med. necessity/level of care/duration of stay  If discussed at [place] of Stay Meetings, dates discussed:    Comments:  01/28/13 11:00 CM met with pt and pt's daughter, Dannette Barbara [phone], to see if pt had made decision for [facility] services.  Pt states she had [facility] and would like to have them again.  CM explained the referral would be made/faxed today but [facility] would not be open and would not receive it until tomorrow 01/29/13 so she may not receive a call for [facility] until 01/30/13.  Contact number on discharge instructions.  MD requested a bedpan be sent rather than a bedside commode; CM gave pt a bedpan to bring home and pt also refused the tub bench bc she states that will be more dangerous for her than no tub transfer bench (daughter agrees). Referral called into [facility] [phone] and accepted by Ailene Ravel.  Facesheet, H&P, PT note, HHorders, Discharge Summary all faxed to [phone] (verified with Ailene Ravel).  Rolling  Walker to be delivered to room prior to discharge.  No other CM needs were communicated.  Mariane Masters, BSN, [place] [phone].  01/26/13 17:03 Tomi Bamberger RN, BSN [phone] patient lives with spouse, Weekend NCM will follow up with HHPT and DME.

## 2013-01-28 NOTE — Discharge Summary (Signed)
Name: Erin Branch MRN: [id number] DOB: 12/25/1933 80 y.o. PCP: Nicoletta Dress, MD  Date of Admission: 01/25/2013  5:00 AM Date of Discharge: 01/28/2013 Attending Physician: Sid Falcon, MD  Discharge Diagnosis: Principal Problem:   Back pain Active Problems:   Hypertension   Hyperlipidemia LDL goal < 100   Breast cancer  Discharge Medications:   Medication List    STOP taking these medications       levofloxacin 750 MG tablet  Commonly known as:  LEVAQUIN      TAKE these medications       amLODipine 5 MG tablet  Commonly known as:  NORVASC  Take 1 tablet (5 mg total) by mouth daily. Patient takes as needed     aspirin 81 MG tablet  Take 243 mg by mouth daily.     CORICIDIN HBP COUGH/COLD PO  Take 1 tablet by mouth once.     DSS 100 MG Caps  Take 100 mg by mouth daily as needed for mild constipation.     feeding supplement (ENSURE COMPLETE) Liqd  Take 237 mLs by mouth 3 (three) times daily with meals.     hydrochlorothiazide 25 MG tablet  Commonly known as:  HYDRODIURIL  Take 1 tablet (25 mg total) by mouth daily. Patient takes as needed     ondansetron 4 MG tablet  Commonly known as:  ZOFRAN  Take 1 tablet (4 mg total) by mouth every 6 (six) hours as needed for nausea or vomiting.     oxyCODONE 5 MG immediate release tablet  Commonly known as:  Oxy IR/ROXICODONE  Take 1 tablet (5 mg total) by mouth every 4 (four) hours as needed for moderate pain.     OxyCODONE 15 mg T12a 12 hr tablet  Commonly known as:  OXYCONTIN  Take 1 tablet (15 mg total) by mouth every 12 (twelve) hours.     polyethylene glycol packet  Commonly known as:  MIRALAX / GLYCOLAX  Take 17 g by mouth daily as needed for mild constipation or moderate constipation.     promethazine-codeine 6.25-10 MG/5ML syrup  Commonly known as:  PHENERGAN with CODEINE  Take 5-10 mLs by mouth at bedtime as needed and may repeat dose one time if needed for cough.     valsartan 160 MG tablet    Commonly known as:  DIOVAN  Take 160 mg by mouth daily.       Disposition and follow-up:   Erin Branch was discharged from [facility] in Stable condition.  At the hospital follow up visit please address:  Back pain--?metastatic disease vs. MM vs. Lymphoma. BM biopsy pending. Oncology will follow as outpatient, Dr. Humphrey Rolls saw in-patient. Pain control during admission, discharged on: oxycontin $RemoveBefor'15mg'sCDflZaTmmUE$  bid and oxycodone $RemoveBefor'5mg'TZVDthZXhUAo$  q4h prn for 1 month supply, along with home health PT, rolling walker, bed pan provided, refused tub transfer bed as recommended by PT.  Given zofran tablets for nausea. Will need to follow up closely with PCP and oncology. Family and patient seem open to palliative care discussions. Did not want to start bisphosphonate therapy in the hospital but will discuss as outpatient.   HTN--Erin Branch does not wish to take any more medication other than her home dose Diovan.  Her other home medications include norvasc $RemoveBeforeD'5mg'kwuhfIzkrQyBIo$  daily and HCTZ $Remove'25mg'GfwcyUR$  daily, but she says she only takes them when she needs to when BP really high, otherwise, she tries to keep her BP in 846'N systolic, as anything  lower makes her feel week.   2.  Labs / imaging needed at time of follow-up: repeat bmet and cbc, monitor Cr (1.25 prior to discharge) and Hb (8.7 prior to discharge)  3.  Pending labs/ test needing follow-up: BM biopsy 01/26/13  Follow-up Appointments:     Follow-up Information   Follow up with [facility]. (for home health physical therapy and aide)    Contact information:   [address] [place], [place] [address] [phone]      Schedule an appointment as soon as possible for a visit with Nicoletta Dress, MD.   Specialty:  Internal Medicine   Contact information:   [place] [address] [phone]       Call Marcy Panning, MD.   Specialty:  Oncology   Contact information:   [place] [address] [phone]       Discharge Instructions: Discharge Orders   Future Appointments Provider Department Dept Phone   06/22/2013 10:30 AM Philmore Pali, NP [facility] [phone]   Future Orders Complete By Expires   Call MD for:  persistant nausea and vomiting  As directed    Call MD for:  severe uncontrolled pain  As directed    Diet general  As directed    Increase activity slowly  As directed    Comments:     Walk with assistance and 24 hour supervision     Consultations: Treatment Team:  Deatra Robinson, MD  Procedures Performed:  Ct Abdomen Pelvis Wo Contrast  01/25/2013   CLINICAL DATA:  Abdominal pain.  Breast cancer.  EXAM: CT ABDOMEN AND PELVIS WITHOUT CONTRAST  TECHNIQUE: Multidetector CT imaging of the abdomen and pelvis was performed following the standard protocol without intravenous contrast.  COMPARISON:  None.  FINDINGS: Mild atelectasis in the lung bases.  Unenhanced images of the liver are negative. Gallbladder and bile ducts are normal. Pancreas and spleen are normal.  Negative for renal calculi. No renal obstruction or mass. Atherosclerotic aorta without aneurysm.  Negative for bowel obstruction or bowel thickening. No adenopathy. Foley catheter in the bladder which appears normal.  Extensive and diffuse skeletal abnormality throughout the spine and pelvis with multiple lytic lesions as well as sclerotic lesions. Findings are compatible with diffuse tumor. This may represent myeloma or metastatic disease. I would favor multiple myeloma. Disc degeneration with anterior slip at L2-3, L3-4, and L4-5. Mild fracture the L2 superior endplate.  IMPRESSION: No acute intra-abdominal abnormality.  Extensive and diffuse skeletal abnormality compatible with widespread myeloma or metastatic disease. Mild fracture of L2.   Electronically Signed   By: Franchot Gallo M.D.   On: 01/25/2013 08:24   Mr Lumbar Spine W Wo Contrast  01/25/2013   CLINICAL DATA:  History of breast cancer.  Abnormal  CT.  EXAM: MRI LUMBAR SPINE WITHOUT AND WITH CONTRAST  TECHNIQUE: Multiplanar and multiecho pulse sequences of the lumbar spine were obtained without and with intravenous contrast.  CONTRAST:  43mL MULTIHANCE GADOBENATE DIMEGLUMINE 529 MG/ML IV SOLN  COMPARISON:  01/25/2013 CT.  FINDINGS: Last fully open disk space is labeled L5-S1. Present examination incorporates from T10-11 disc space through the S2 level.  Conus L1-2 level. No abnormal enhancement of the distal cord, conus or nerve roots.  Diffuse altered signal intensity of all visualized bone marrow. Diffuse heterogeneous enhancement. Findings suspicious for tumor whether metastatic disease, lymphoma or myeloma.  Although there may be minimal expansion of bony elements,  no significant epidural spread of tumor is detected.  The CT detected cortical interruption of the L2 superior endplate is not as well appreciated on present examination.  Schmorl's node deformities T11-12, T12-L1, L1-2 new, L2-3 and L5-S1.  Bilateral renal cysts suspected. Limited imaging of the liver without mass identified.  Degenerative changes including:  T10-11:  Negative.  T11-12:  Negative.  T12-L1: Mild compression of the superior endplate with minimal posterior displacement slightly greater to left with very mild spinal stenosis.  L1-2:  Negative.  L2-3: Moderate facet joint degenerative changes. 2 mm anterior slip of L2. Bulge. Ligamentum flavum hypertrophy. Multifactorial mild spinal stenosis with a trefoil appearance. Very mild bilateral foraminal narrowing.  L3-4: Moderate facet joint degenerative changes. Ligamentum flavum hypertrophy. Minimal anterior slippage of L3. Bulge. Multifactorial mild spinal stenosis with a slightly trefoil appearance. Minimal bilateral foraminal narrowing.  L4-5: Moderate right-sided and mild left-sided facet joint degenerative changes. Ligamentum flavum hypertrophy. Mild bulge. Minimal right lateral recess stenosis. Very mild spinal stenosis.  L5-S1:  Mild to moderate facet joint degenerative changes. Minimal bulge.  IMPRESSION: Diffuse altered signal intensity of all visualized bone marrow. Diffuse heterogeneous enhancement. Findings suspicious for tumor whether metastatic disease, lymphoma or myeloma.  Although there may be minimal expansion of bony elements, no significant epidural spread of tumor is detected.  The CT detected cortical interruption of the L2 superior endplate is not as well appreciated on present examination. L1 superior endplate Schmorl's node deformity/compression fracture with slight retropulsion minimally more notable to the left.  Schmorl's node deformities T11-12, T12-L1, L1-2 new, L2-3 and L5-S1.  L2-3 multifactorial mild spinal stenosis with a trefoil appearance. Very mild bilateral foraminal narrowing.  L3-4 multifactorial mild spinal stenosis with a slightly trefoil appearance. Minimal bilateral foraminal narrowing.  L4-5 multifactorial minimal right lateral recess stenosis. Very mild spinal stenosis.  L5-S1 Mild to moderate facet joint degenerative changes. Minimal bulge.   Electronically Signed   By: Bridgett Larsson M.D.   On: 01/25/2013 15:51   Ct Biopsy  01/26/2013   CLINICAL DATA:  BREAST CANCER, ABNORMAL BONE MARROW BY MRI CONCERNING FOR INFILTRATIVE METASTATIC PROCESS VERSUS MULTIPLE MYELOMA  EXAM: CT GUIDED RIGHT ILIAC BONE MARROW ASPIRATION AND CORE BIOPSY  Date:  1/9/20151/09/2013 9:49 AM  Radiologist:  Judie Petit. Ruel Favors, MD  Guidance:  CT  MEDICATIONS AND MEDICAL HISTORY: VERSED AND FENTANYL FOR CONSCIOUS SEDATION  ANESTHESIA/SEDATION: 15 MIN  CONTRAST:  NONE.  PROCEDURE: Informed consent was obtained from the patient following explanation of the procedure, risks, benefits and alternatives. The patient understands, agrees and consents for the procedure. All questions were addressed. A time out was performed.  The patient was positioned prone and noncontrast localization CT was performed of the pelvis to demonstrate the iliac  marrow spaces.  Maximal barrier sterile technique utilized including caps, mask, sterile gowns, sterile gloves, large sterile drape, hand hygiene, and betadine prep.  Under sterile conditions and local anesthesia, an 11 gauge coaxial bone biopsy needle was advanced into the RIGHT iliac marrow space. Needle position was confirmed with CT imaging. Initially, bone marrow aspiration was attempted however no aspirate could be obtained, presumed secondary to the infiltrative marrow process. Next, the 11 gauge outer cannula was utilized to obtain a 2 right iliac bone marrow core biopsies. Needle was removed. Hemostasis was obtained with compression. The patient tolerated the procedure well. Samples were prepared with the cytotechnologist. No immediate complications.  COMPLICATIONS: NO IMMEDIATE  IMPRESSION: CT guided right iliac bone marrow core biopsy.   Electronically  Signed   By: Daryll Brod M.D.   On: 01/26/2013 10:50   Admission HPI: Patient is a 79 year old lady with past medical history of stroke, hypertension, hyperlipidemia, breast cancer (S/P partial mastectomy 2006), who presents with sever back pain and urinary incontinence.  Patient reports that she has never been back to her [facility] since she was diagonsed with stroke 05/2012. She has poor balance and feels that her body is not right. She is currently taking ASA 324 mg daily for secondary stroke prevention and Diovan for HTN. Three days ago, she started having back pain, which is located at the lower back, radiating to the right flank and abdominal area. It is associated with nausea and vomiting. She vomited 3 or 4 times, without blood in the vomitus. No diarrhea. The back pain is progressively getting worse, currently 10 out of 10 in severity. It is so severe that she can not move and walk. Last night she developed urinary incontinence. He does not have symptoms for UTI, such as dysuria or urgency. No focal leg weakness or numbness. Patient was  found to have extensive and diffuse skeletal abnormality on CT scan, compatible with widespread myeloma or metastatic disease per radiologist.  Of note, patient was diagnosed with right side breast cancer in 2006. She is s/p of partial mastectomy without radiation and chemotherapy. She has been followed up by oncologist until 2009. Her last mammogram was 4 years ago, which was normal. She did not notice any new lumps in breasts. She reports weight loss of 30 Lbs over past 6 months. Her father died of lung cancer and sister died fo colon cancer.  Hospital Course by problem list: Principal Problem:   Back pain Active Problems:   Hypertension   Hyperlipidemia LDL goal < 100   Breast cancer   Nausea with vomiting  Back pain--secondary to ?MM vs. Metastatic disease (hx of breast cancer) vs. Lymphoma.  Bone marrow biopsy done 01/26/13, results pending. Will follow up with oncology (seen by Dr. Humphrey Rolls in hospital) and pcp as outpatient.  Extensive and diffuse skeletal abnormality compatible with widespread myeloma or metastatic disease and mild fracture of L2 on CT with diffuse altered signal intensity of all visualized bone marrow on MRI.  Pain control with oxycontin bid and oxycodone prn during admission and on discharge, will likely need to be titrated with oncology or pcp as outpatient. Initially required IV morphine for breakthrough as well but not near time of discharge.  Nausea and vomiting likely secondary to decreased po intake and pain medication, controlled as much as possible with prn zofran and will be continued on discharge as well.  PT evaluated during hospital stay and recommended home health PT with rolling walker, 24 hour supervision, tub transfer bench (pt refused).  Home health was arranged at discharge for PT and nursing aide and equipment was arranged by CM.recommend discussion with pcp or oncology in regards to bisphosphonate therapy.  Ms. Worthy initially wanted to try bisphosphonate infusion  during admission but then changed her mind due to side effect profile but would like to discuss it as an outpatient.  Oncology mentioned Zometa as a good option that she may be interested but was not available in the hospital.   Hypertension--Ms. Wallington refused to take any more than her home dose of Diovan during admission.  Home medications include Diovan, Norvasc, and HCTZ but she says she only takes the norvasc and HCTZ when she absolutely has to when BP very high.  She did not want them during admission and was maintained during admission with Avapro $RemoveBef'150mg'bRxRPscytN$  qd. BP elevated at times likely secondary to pain and discomfort from nausea and occasional vomiting. Recommended to follow up closely with PCP and take medications as prescribed and as tolerated, especially given hx of CVA and risk factor modifcation.  Will keep medications on med list for now but patient says she will take as she has been doing.      Hyperlipidemia--LDL 166 with cholesterol 251 05/2012.  Likely should be on statin, but patient does not want new medications to be started at this time. Follow up with PCP.   Nausea with decreased po intake--occasional vomiting, decreased appetite. Likely 2/2 to cancer and pain medication. Encouraged to keep up IVF, supplement meals with ensure as much as possible.  Also discussed possible appetite stimulants to be addressed with oncology or pcp as outpatient, but patient is not keen on those at this time.  Given IVF hydration during admission.  Zofran prn for nausea.   Discharge Vitals:   BP 194/72  Pulse 69  Temp(Src) 98.7 F (37.1 C) (Oral)  Resp 18  Ht $R'5\' 3"'ri$  (1.6 m)  Wt 122 lb 2.2 oz (55.4 kg)  BMI 21.64 kg/m2  SpO2 90%  Discharge Labs:  Basic Metabolic Panel:  Recent Labs  01/26/13 0627  NA 142  K 4.5  CL 104  CO2 23  GLUCOSE 82  BUN 17  CREATININE 1.25*  CALCIUM 11.3*   CBC:  Recent Labs  01/27/13 0345  WBC 7.4  HGB 8.7*  HCT 27.1*  MCV 81.6  PLT 286   Cardiac  Enzymes:  Recent Labs  01/25/13 1700 01/26/13 0033  TROPONINI <0.30 <0.30   Coagulation:  Recent Labs  01/25/13 1700  LABPROT 13.5  INR 1.05   Signed: Jerene Pitch, MD 01/28/2013, 3:11 PM   Time Spent on Discharge: 60 minutes Services Ordered on Discharge: home health PT and nurse's aide Equipment Ordered on Discharge: rolling walker, bed pan. Refused tub transfer bench

## 2013-01-28 NOTE — Progress Notes (Signed)
Subjective: Erin Branch was seen and examined at bedside this morning with her daughter in the room as well.  She says she feels well and rested today with no pain at this time.  She was able to use the bathroom on her own multiple times yesterday, getting to the bathroom but then using the bed pan when she was tired.  She does not want a bedside commode.  The daughter had more questions this morning which we answered and they are hopeful to follow up closely with oncology, their primary care doctor, and likely palliative care as an outpatient as well.    Objective: Vital signs in last 24 hours: Filed Vitals:   01/27/13 1340 01/27/13 2054 01/28/13 0500 01/28/13 1029  BP: 165/65 141/46 168/71 174/74  Pulse: 61 60 64 72  Temp: 96.3 F (35.7 C) 98.1 F (36.7 C) 98 F (36.7 C)   TempSrc: Axillary Oral Oral   Resp: $Remo'18 18 18   'gGRTS$ Height:      Weight:   122 lb 2.2 oz (55.4 kg)   SpO2: 90% 91% 91%    Weight change: -3 lb 12 oz (-1.7 kg) No intake or output data in the 24 hours ending 01/28/13 1058 Physical Exam:  General: lying in bed, NAD HEENT: EOMI Lungs: CTA B/L Heart: RRR Abdomen: soft, NT, ND, +bs  Extremities: warm, no peripheral edema, moving all 4 extremities GU: foley in place Neurologic: alert & oriented X3, cranial nerves II-XII intact, decreased strength in all extremities mainly due to generalized weakness and pain  Lab Results: Basic Metabolic Panel:  Recent Labs Lab 01/25/13 0522 01/26/13 0627  NA 142 142  K 3.9 4.5  CL 102 104  CO2 27 23  GLUCOSE 95 82  BUN 15 17  CREATININE 1.00 1.25*  CALCIUM 11.4* 11.3*   Liver Function Tests:  Recent Labs Lab 01/25/13 0522  AST 24  ALT 11  ALKPHOS 137*  BILITOT 0.4  PROT 7.2  ALBUMIN 3.2*    Recent Labs Lab 01/25/13 0522  LIPASE 39   CBC:  Recent Labs Lab 01/25/13 0842 01/27/13 0345  WBC 8.4 7.4  NEUTROABS 5.1  --   HGB 9.5* 8.7*  HCT 29.4* 27.1*  MCV 81.4 81.6  PLT 308 286   Cardiac  Enzymes:  Recent Labs Lab 01/25/13 0538 01/25/13 1700 01/26/13 0033  TROPONINI <0.30 <0.30 <0.30   Coagulation:  Recent Labs Lab 01/25/13 1700  LABPROT 13.5  INR 1.05   Urinalysis:  Recent Labs Lab 01/25/13 0608  COLORURINE YELLOW  LABSPEC 1.006  PHURINE 7.5  GLUCOSEU NEGATIVE  HGBUR NEGATIVE  BILIRUBINUR NEGATIVE  KETONESUR NEGATIVE  PROTEINUR NEGATIVE  UROBILINOGEN 0.2  NITRITE NEGATIVE  LEUKOCYTESUR NEGATIVE   Medications: I have reviewed the patient's current medications. Scheduled Meds: . aspirin  243 mg Oral Daily  . docusate sodium  100 mg Oral Daily  . feeding supplement (ENSURE COMPLETE)  237 mL Oral BID BM  . heparin  5,000 Units Subcutaneous Q8H  . irbesartan  150 mg Oral Daily  . OxyCODONE  15 mg Oral Q12H  . sodium chloride  3 mL Intravenous Q12H   Continuous Infusions:   PRN Meds:.acetaminophen, acetaminophen, morphine injection, ondansetron, oxyCODONE, polyethylene glycol  Assessment/Plan: Patient is a 80yo WF with past medical history of CVA, HTN, HLD, stage 1 breast cancer (S/P partial mastectomy 2006), admitted for severe back pain and found to have extensive lytic lesions to pelvis and spine, c/w multiple myeloma or metastatic disease on  imaging.   Back pain 2/2 ?multiple myeloma vs metastatic disease (hx of breast cancer s/p lumpectomy) vs lymphoma: bone marrow biopsy pending.  Ms. Vandruff has discussed that pain control is her most important wish at this time and feeling rested.  She feels that her pain is well controlled at this time with her current oral regimen and would like to continue that as an outpatient with adjustments made by oncology as needed and pcp.  She wishes to discuss bisphosphonate therapy further as an outpatient.  The family seems interested in palliative care and will discuss further with pcp and oncology.   -d/c home with home health PT and rolling walker and tub transfer bench and bed pan -f/u Bone marrow biopsy, done  01/26/13 -pain control: Oxycontin 15 mg twice a day, oxycodone 5 mg prn q4h.  -zofran for nausea prn  -miralax and colace prn -oncology follow up as outpatient, was seen by Dr. Chancy Milroy in patient  Hypertension: elevated likely in setting of pain and we were holding her CCB.  - continue Avapro.  Ms. Aughenbaugh does not want to resume norvasc and wants to stay on monotherapy with hone dose of Diovan  Hyperlipidemia: LDL was 166 on 06/14/12. Not on medications at home.  -consider starting statin as outpt   Hx of stroke: Diagnosed in 5/204. No significant neurologic deficit, except for poor balance.  -will continue Home ASA, BP control. Recommend statin as outpatient if tolerating extra medications.   Diet: regular with ensure supplements DVT px: Heparin sq Dispo: d/c home today  The patient does have a current PCP Nicoletta Dress, MD) and does not need an [facility] hospital follow-up appointment after discharge.  The patient does not have transportation limitations that hinder transportation to clinic appointments.  Services Needed at time of discharge: Y = Yes, Blank = No PT: Home health  OT:   RN:   Equipment: rolling walker, tub transfer bench, bed pan  Other: 24 hour supervision    LOS: 3 days   Jerene Pitch, MD 01/28/2013, 10:58 AM

## 2013-01-28 NOTE — Discharge Instructions (Signed)
Dear Erin Branch, thank you for letting us take care of you this admission. We hope you start feeling better soon.  Please follow up with oncology, their office will contact you, but just in case we have provided you with their contact information as well  Please follow up with your primary care doctor within 1 week, their office is closed today but please call and schedule a follow up appointment  We have prescribed your pain medication, please take as needed but monitor for sedation and do not take if you are very sleepy  Please always have 24 hour supervision and walk with assistance to try to prevent falls  Continue to monitor and treat your BP. You have asked for your medications to stay on your med list but you adjust them based on your BP daily.   Please follow up with home health PT services  Try to keep up your nutrition and fluid's as much as possible. Try to drink ensure for supplementation as well.  We have prescribed you with nausea medication in case you need that as well.

## 2013-01-28 NOTE — Progress Notes (Signed)
Patient discharged home with daughter. Instruction and medication prescription was given. All question were answered. Pt wheelchaired down with tech.   Quintavious Rinck J. Conley Canal RN

## 2013-01-29 ENCOUNTER — Telehealth: Payer: Self-pay | Admitting: Oncology

## 2013-01-29 NOTE — Telephone Encounter (Signed)
[facility] NP APPT 01/19 @ 9:30 W/DR. [place] PACKET MAILED.

## 2013-01-29 NOTE — Telephone Encounter (Signed)
C/D 01/29/13 for appt. 02/05/13 °

## 2013-01-30 ENCOUNTER — Telehealth: Payer: Self-pay | Admitting: *Deleted

## 2013-01-30 LAB — UIFE/LIGHT CHAINS/TP QN, 24-HR UR
ALBUMIN, U: DETECTED
Alpha 1, Urine: DETECTED — AB
Alpha 2, Urine: DETECTED — AB
Beta, Urine: DETECTED — AB
FREE KAPPA LT CHAINS, UR: 3.42 mg/dL — AB (ref 0.14–2.42)
FREE LAMBDA LT CHAINS, UR: 1.26 mg/dL — AB (ref 0.02–0.67)
Free Kappa/Lambda Ratio: 2.71 ratio (ref 2.04–10.37)
GAMMA UR: DETECTED — AB
Total Protein, Urine: 6.4 mg/dL

## 2013-01-30 LAB — PROTEIN ELECTROPH W RFLX QUANT IMMUNOGLOBULINS
Albumin ELP: 48.8 % — ABNORMAL LOW (ref 55.8–66.1)
Alpha-1-Globulin: 7.8 % — ABNORMAL HIGH (ref 2.9–4.9)
Alpha-2-Globulin: 18.5 % — ABNORMAL HIGH (ref 7.1–11.8)
Beta 2: 6.1 % (ref 3.2–6.5)
Beta Globulin: 5.7 % (ref 4.7–7.2)
Gamma Globulin: 13.1 % (ref 11.1–18.8)
M-SPIKE, %: NOT DETECTED g/dL
TOTAL PROTEIN ELP: 6 g/dL (ref 6.0–8.3)

## 2013-01-30 NOTE — Telephone Encounter (Signed)
Pt's daughter called to get results of bone biopsy.  Dannette Barbara @ [phone]

## 2013-01-30 NOTE — Discharge Summary (Signed)
I saw Erin Branch on day of discharge and assisted with the discharge planning.

## 2013-01-30 NOTE — Telephone Encounter (Signed)
Dr Eula Fried to call daughter

## 2013-01-30 NOTE — Telephone Encounter (Signed)
I called Erin Branch and received the home phone number of Erin Branch to review biopsy results.  Erin Branch then gave me permission on the phone to review biopsy results with her son, Erin Branch, as she was unable to be on the phone.   I reviewed the results of the BM biopsy with Erin Branch showing Extensive involvement by metastatic carcinoma consistent with breast primary.  He will relay the results to [name] and voices understanding of the results.   Peripheral blood report showed normocytic-normochromic anemia with left shift neutrophilic.   These results will need to be addressed with oncology and pcp further, which they said they will do.

## 2013-01-31 LAB — IMMUNOFIXATION ADD-ON

## 2013-01-31 LAB — IGG, IGA, IGM
IGG (IMMUNOGLOBIN G), SERUM: 815 mg/dL (ref 690–1700)
IgA: 200 mg/dL (ref 69–380)
IgM, Serum: 79 mg/dL (ref 52–322)

## 2013-02-05 ENCOUNTER — Ambulatory Visit: Payer: Medicare Other | Admitting: Oncology

## 2013-02-05 ENCOUNTER — Ambulatory Visit: Payer: Medicare Other

## 2013-02-05 ENCOUNTER — Other Ambulatory Visit: Payer: Medicare Other

## 2013-02-06 LAB — CHROMOSOME ANALYSIS, BONE MARROW

## 2013-02-08 ENCOUNTER — Telehealth: Payer: Self-pay | Admitting: Oncology

## 2013-02-08 NOTE — Telephone Encounter (Signed)
Spoke to pathology regarding patient's bone marrow biopsy results. The pathology is consistent with metastatic carcinoma consistent with a breast primary. The tumor per verbal report is estrogen receptor positive progesterone receptor positive. We had sent off a HER-2/neu receptor testing.  I tried to call to patient on her home number left a message for her to call us back.  we tried to set up athe patient office visit for 02/05/2013. Voicemail was last by the new patient cord nadirs but no one returned the call up to date.  We'll continue to try to call the patient for her to be seen at the [place] as soon as possible.   Marcy Panning, MD Medical/Oncology [facility] [phone] (beeper) [phone] (Office)  02/08/2013, 3:34 PM

## 2013-02-12 ENCOUNTER — Encounter: Payer: Self-pay | Admitting: *Deleted

## 2013-02-13 ENCOUNTER — Telehealth: Payer: Self-pay | Admitting: *Deleted

## 2013-02-13 NOTE — Telephone Encounter (Signed)
sw pt gv appt for 02/22/13 @ 1pm. Will mail  A letter/avs per pt request...td

## 2013-02-19 ENCOUNTER — Telehealth: Payer: Self-pay | Admitting: Emergency Medicine

## 2013-02-19 ENCOUNTER — Other Ambulatory Visit: Payer: Self-pay | Admitting: Emergency Medicine

## 2013-02-19 NOTE — Telephone Encounter (Signed)
Patient's daughter called regarding upcoming appointment. Per patient's daughter, Ms Sachdeva is being seen by Dr Myrene Buddy at [facility]. Appointment canceled for Dr Humphrey Rolls, Dr Humphrey Rolls aware.

## 2013-02-22 ENCOUNTER — Ambulatory Visit: Payer: Medicare Other | Admitting: Oncology

## 2013-06-19 ENCOUNTER — Encounter (HOSPITAL_COMMUNITY): Payer: Self-pay

## 2013-06-22 ENCOUNTER — Ambulatory Visit: Payer: Medicare Other | Admitting: Nurse Practitioner

## 2013-11-08 NOTE — Telephone Encounter (Signed)
error 

## 2014-07-15 ENCOUNTER — Other Ambulatory Visit: Payer: Self-pay

## 2014-08-08 IMAGING — CT CT ABD-PELV W/O CM
2 of 4 series · 17 of 46 positions shown, 19 images · non-contrast
Comparison: None.

CLINICAL DATA: Abdominal pain.  Breast cancer.

EXAM:
CT ABDOMEN AND PELVIS WITHOUT CONTRAST
TECHNIQUE: Multidetector CT imaging of the abdomen and pelvis was performed
following the standard protocol without intravenous contrast.

[Series 2: routine · axial · 0.73mm/px · z∈[-440,-25]mm · 14 of 91 slices shown, 16 images]
[im 4/91  soft-tissue]
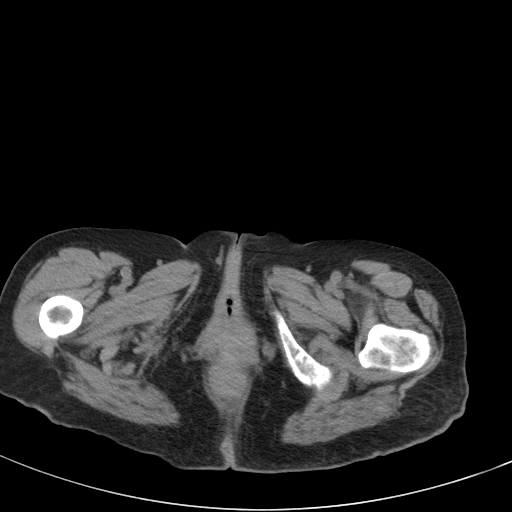
[im 4/91  bone]
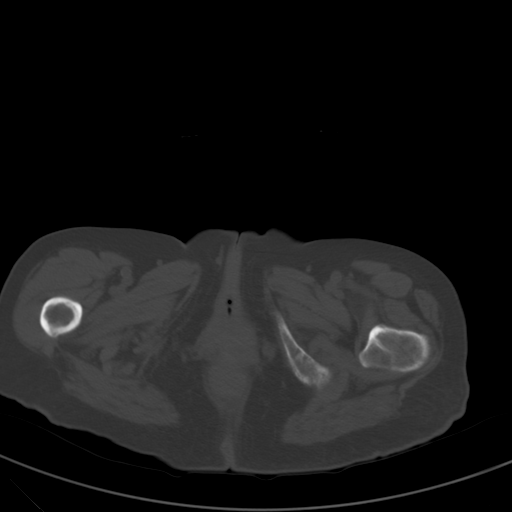
[im 11/91  soft-tissue]
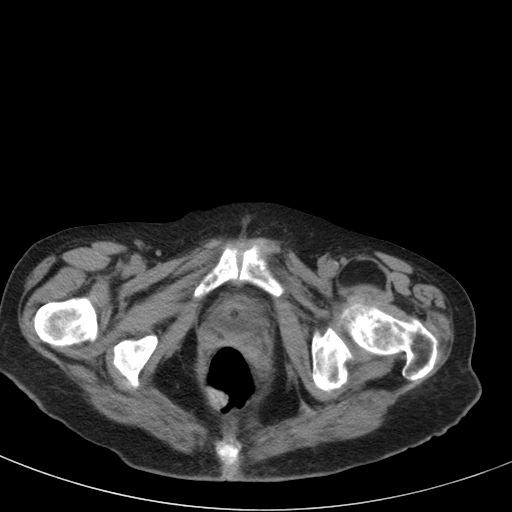
[im 19/91  soft-tissue]
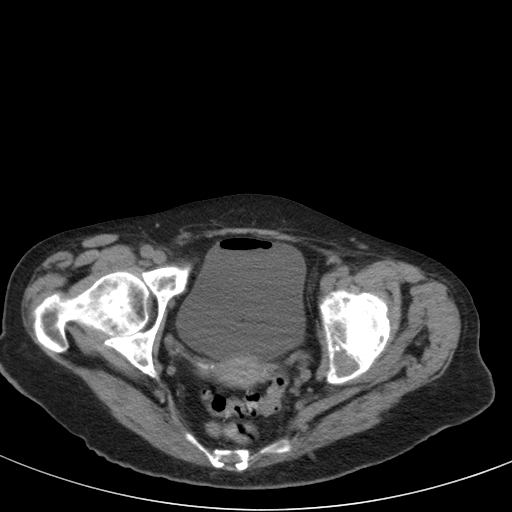
[im 26/91  soft-tissue]
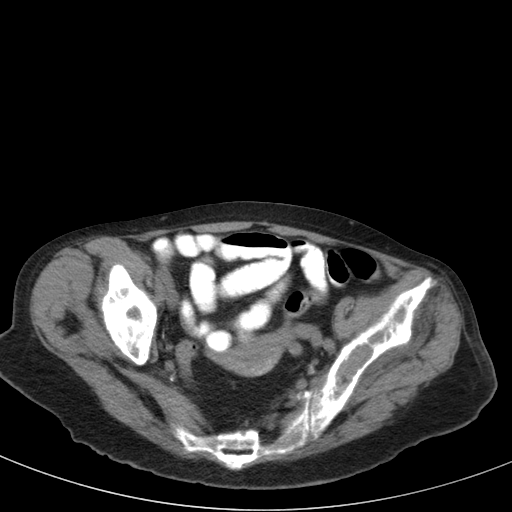
[im 29/91  soft-tissue]
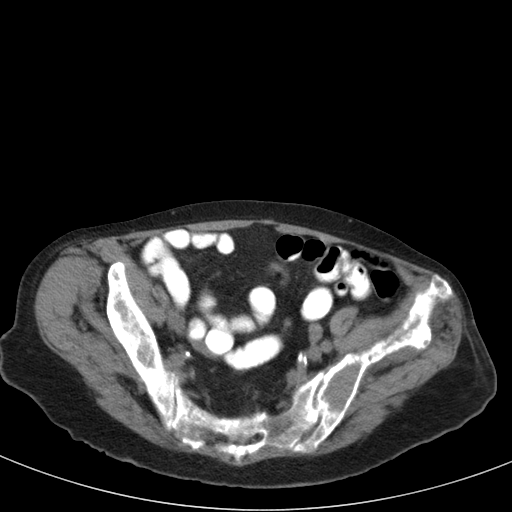
[im 37/91  soft-tissue]
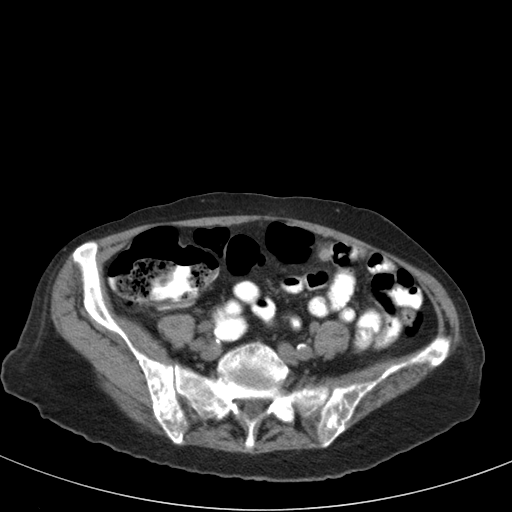
[im 44/91  soft-tissue]
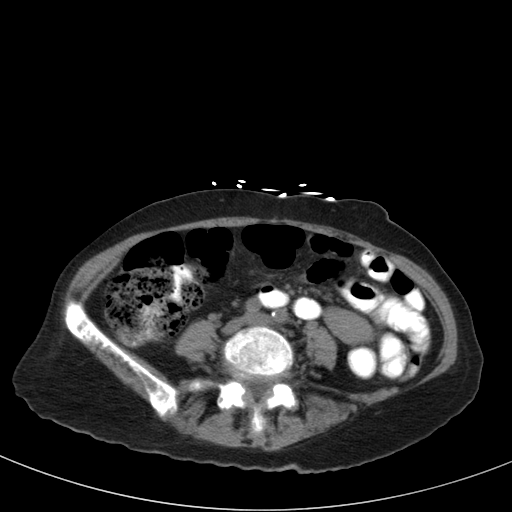
[im 47/91  soft-tissue]
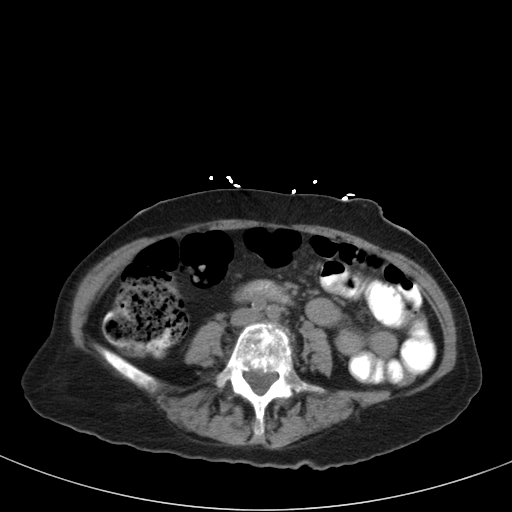
[im 55/91  soft-tissue]
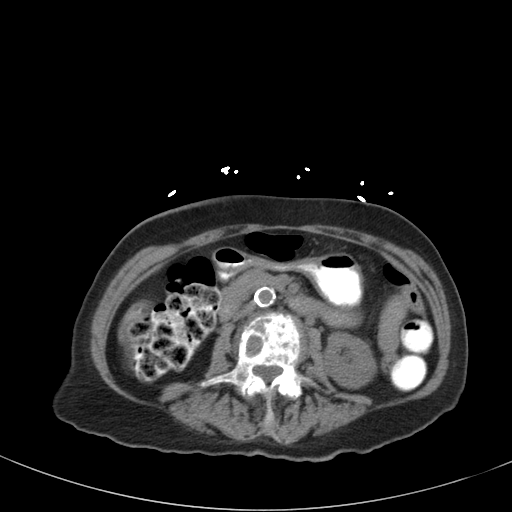
[im 55/91  bone]
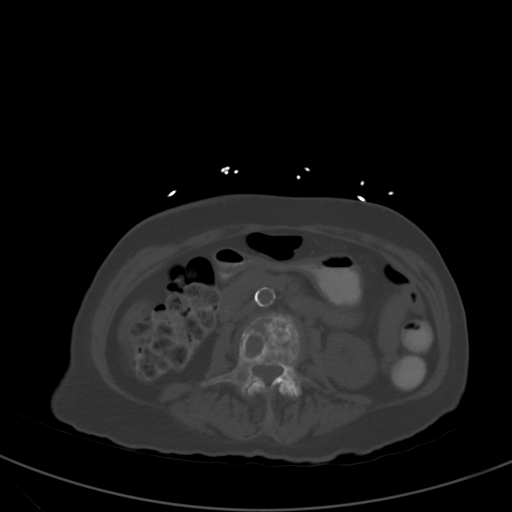
[im 62/91  soft-tissue]
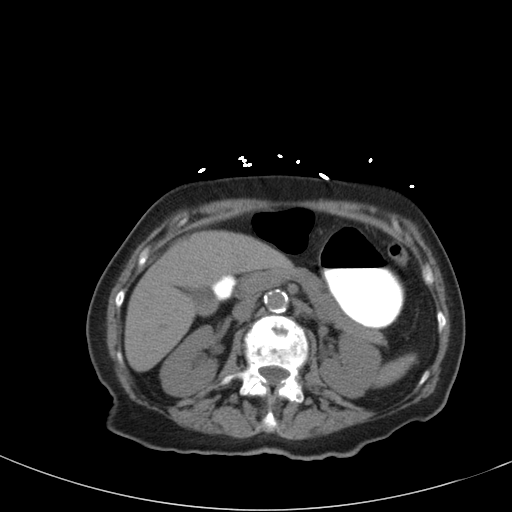
[im 69/91  soft-tissue]
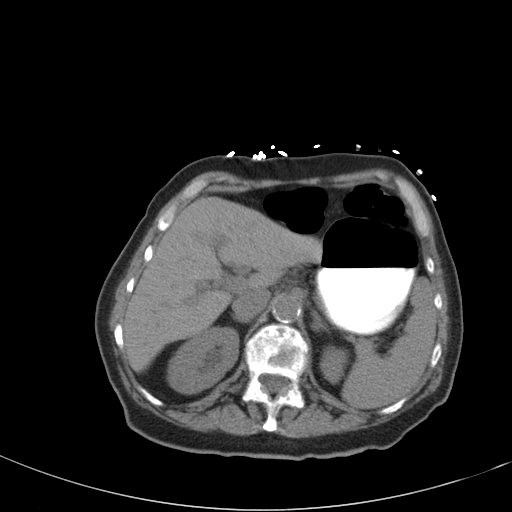
[im 73/91  soft-tissue]
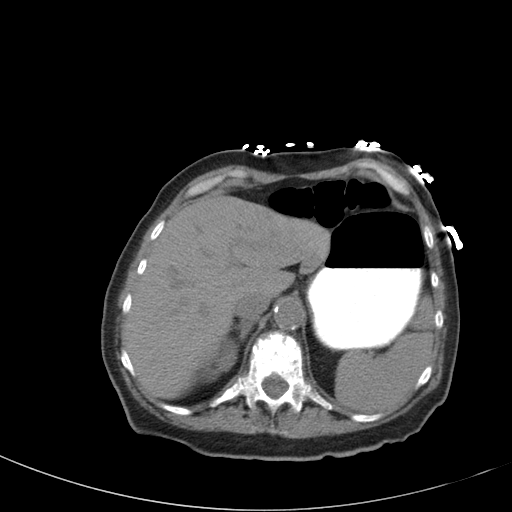
[im 80/91  soft-tissue]
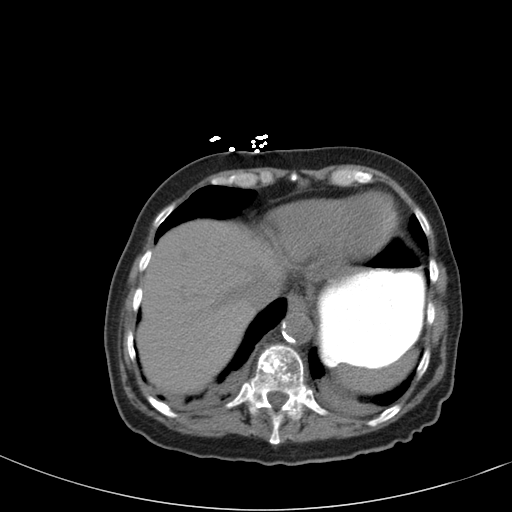
[im 87/91  soft-tissue]
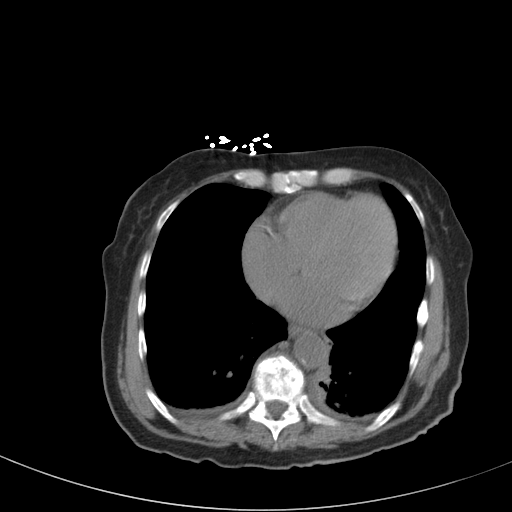

[cor · coronal · 0.88mm/px · 3 of 94 slices shown]
[im 32/94  soft-tissue]
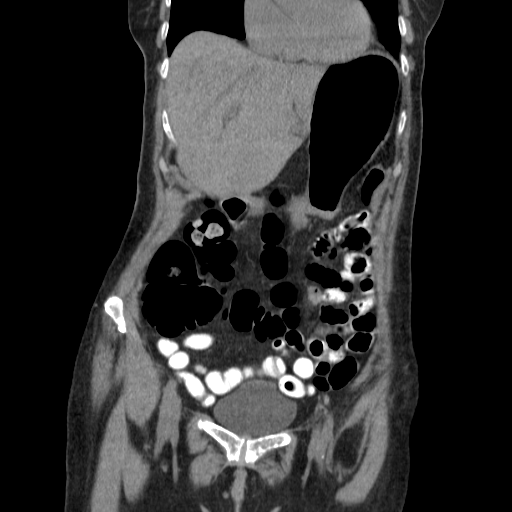
[im 42/94  soft-tissue]
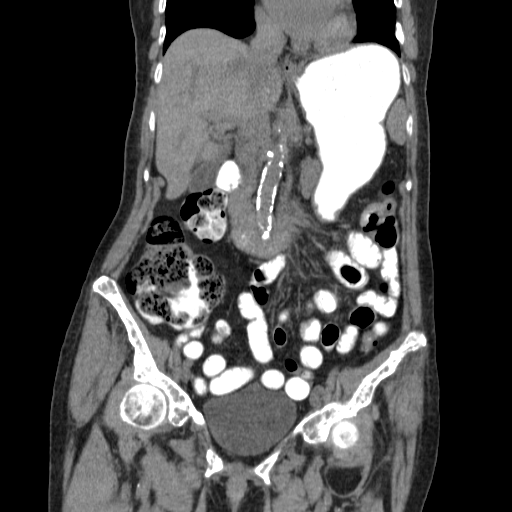
[im 52/94  soft-tissue]
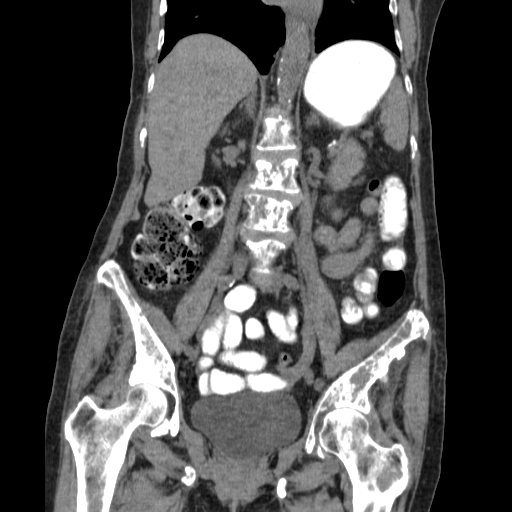

[17 of 46 positions shown; findings below may reference images not displayed]

FINDINGS: Mild atelectasis in the lung bases.

Unenhanced images of the liver are negative. Gallbladder and bile
ducts are normal. Pancreas and spleen are normal.

Negative for renal calculi. No renal obstruction or mass.
Atherosclerotic aorta without aneurysm.

Negative for bowel obstruction or bowel thickening. No adenopathy.
Foley catheter in the bladder which appears normal.

Extensive and diffuse skeletal abnormality throughout the spine and
pelvis with multiple lytic lesions as well as sclerotic lesions.
Findings are compatible with diffuse tumor. This may represent
myeloma or metastatic disease. I would favor multiple myeloma. Disc
degeneration with anterior slip at L2-3, L3-4, and L4-5. Mild
fracture the L2 superior endplate.
IMPRESSION: No acute intra-abdominal abnormality.

Extensive and diffuse skeletal abnormality compatible with
widespread myeloma or metastatic disease. Mild fracture of L2.

## 2014-08-09 IMAGING — CT CT BIOPSY
1 of 2 series · 15 of 32 positions shown, 19 images · non-contrast
Comparison: none

CLINICAL DATA: BREAST CANCER, ABNORMAL BONE MARROW BY MRI
CONCERNING FOR INFILTRATIVE METASTATIC PROCESS VERSUS MULTIPLE
MYELOMA

[Series 2: i-spiral 5.0 b40f · axial · 0.72mm/px · z∈[-258,-164]mm · 15 of 31 slices shown, 19 images]
[im 2/31  soft-tissue]
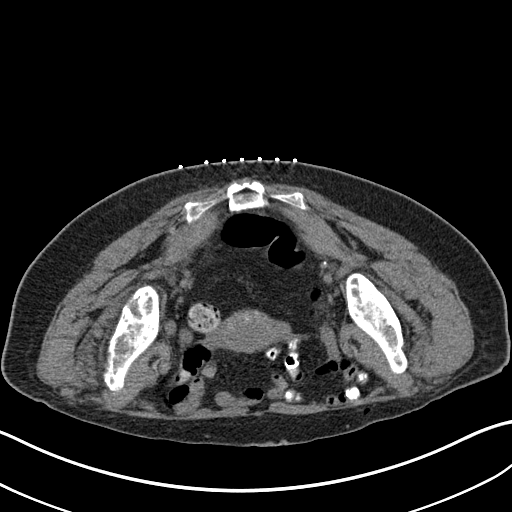
[im 2/31  bone]
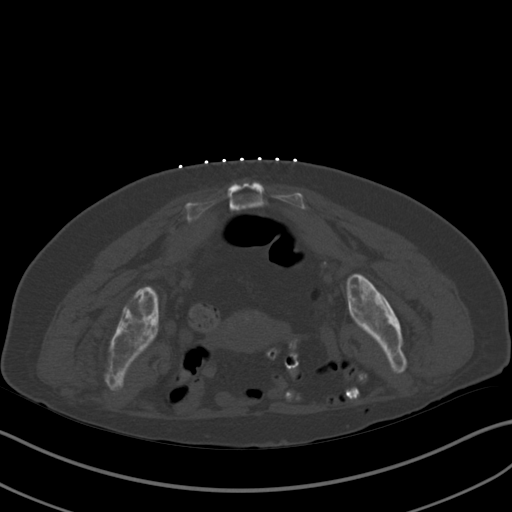
[im 5/31  soft-tissue]
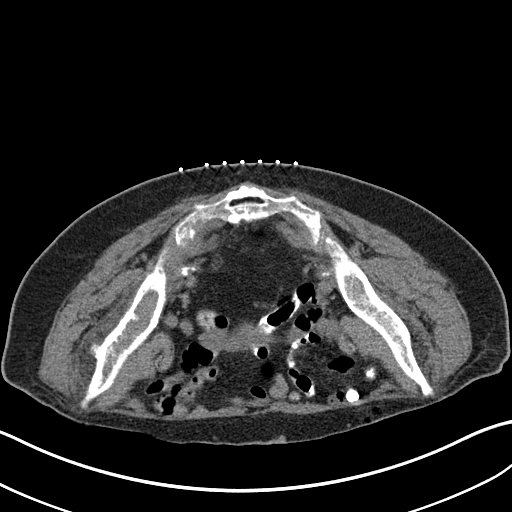
[im 7/31  soft-tissue]
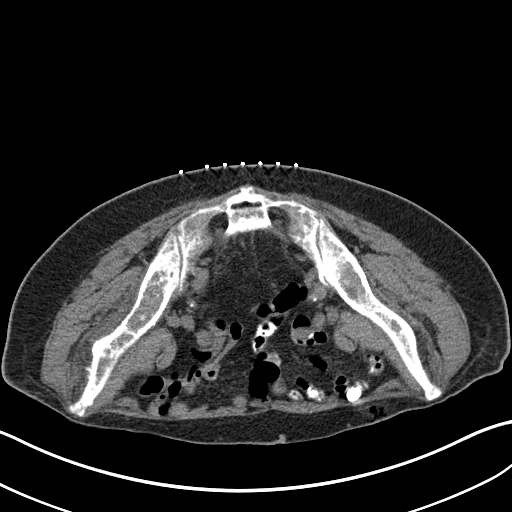
[im 9/31  soft-tissue]
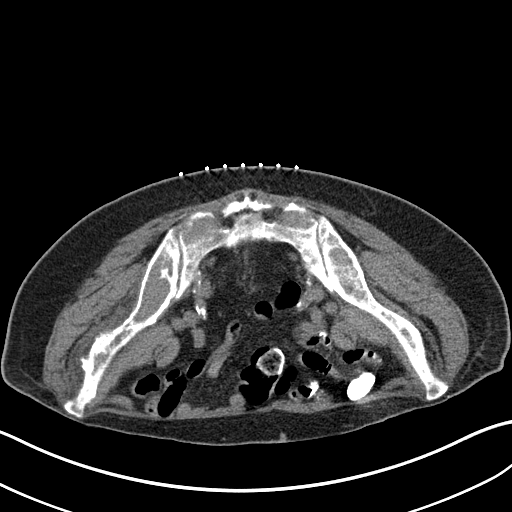
[im 11/31  soft-tissue]
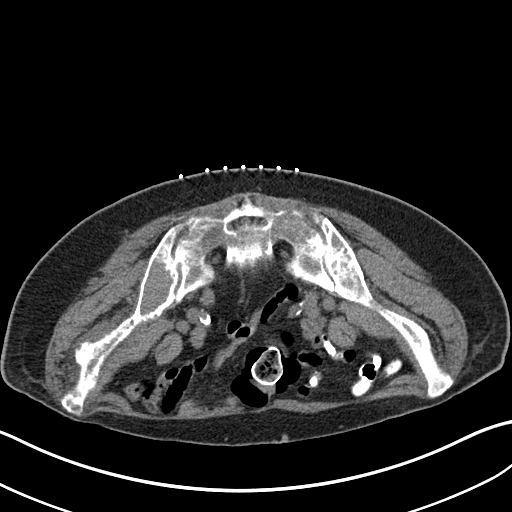
[im 13/31  soft-tissue]
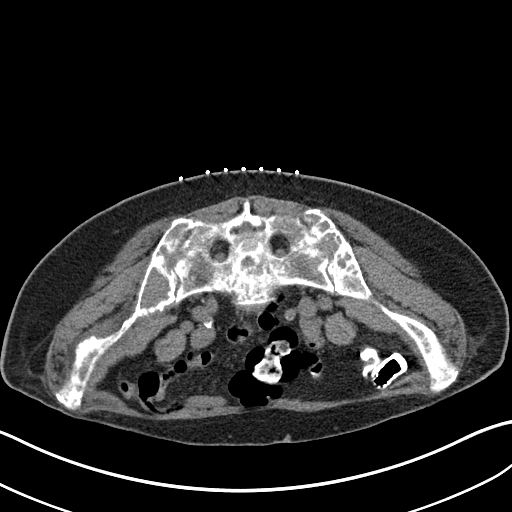
[im 15/31  soft-tissue]
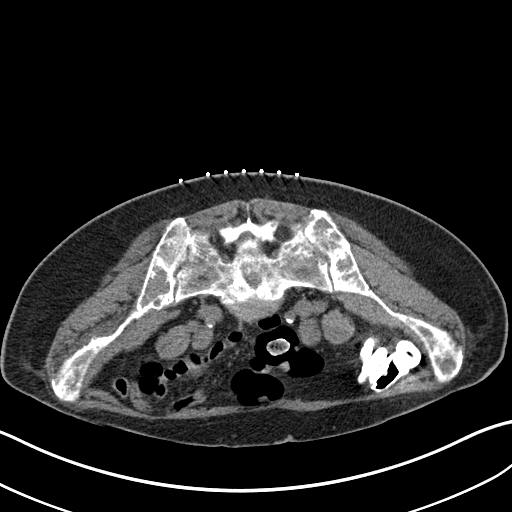
[im 18/31  soft-tissue]
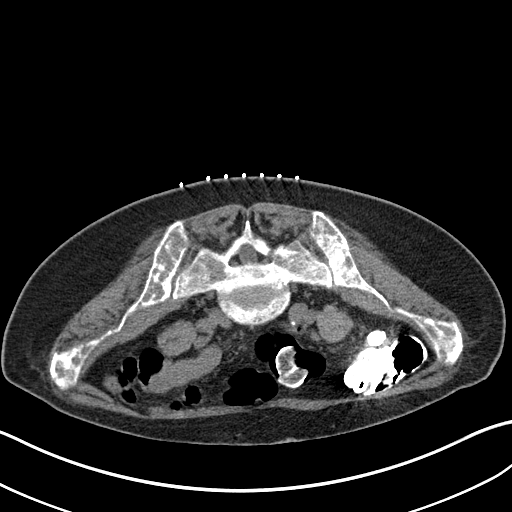
[im 20/31  soft-tissue]
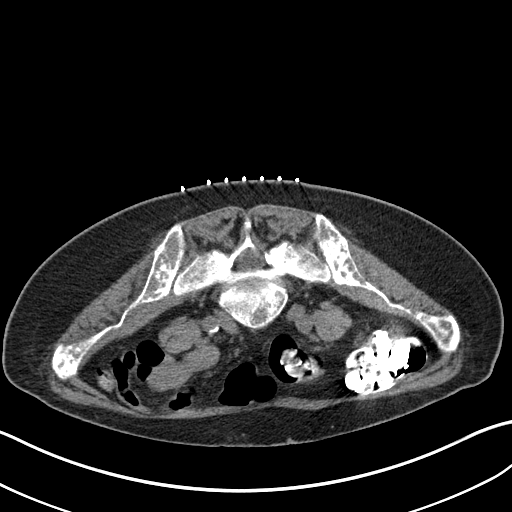
[im 20/31  bone]
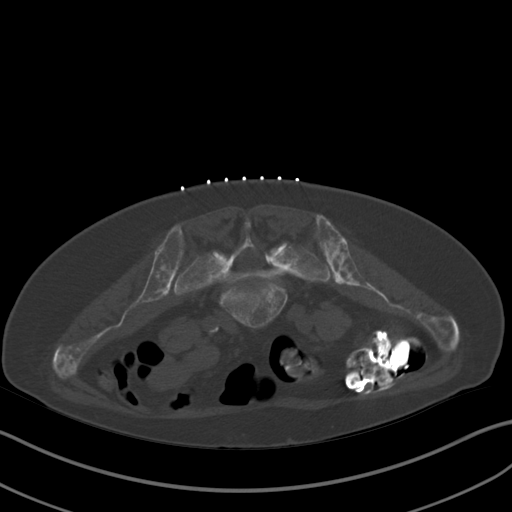
[im 22/31  soft-tissue]
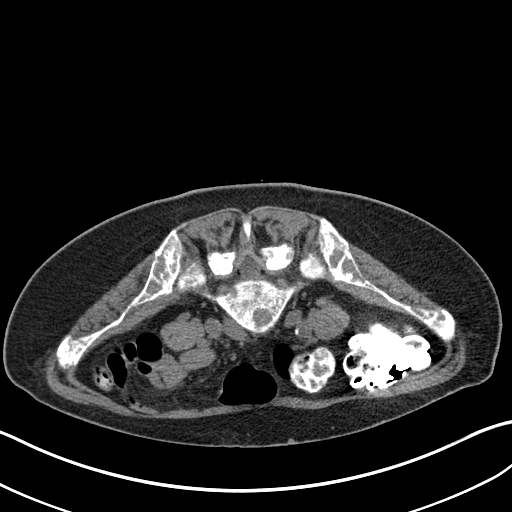
[im 24/31  soft-tissue]
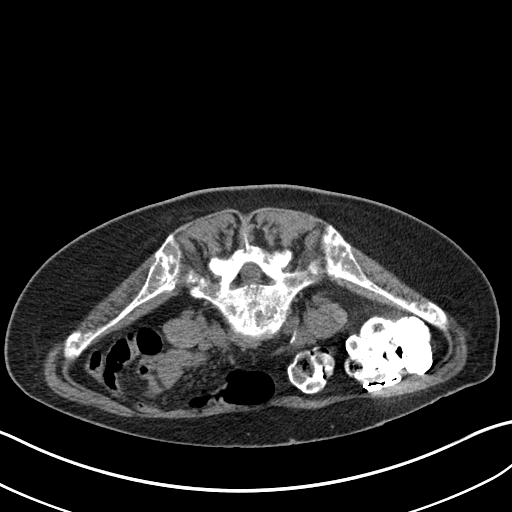
[im 25/31  lung]
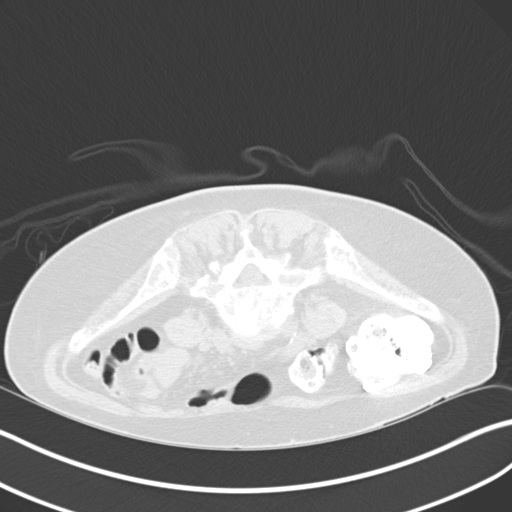
[im 26/31  soft-tissue]
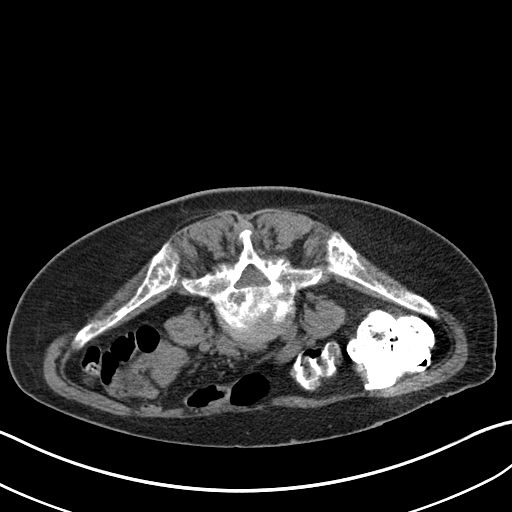
[im 26/31  lung]
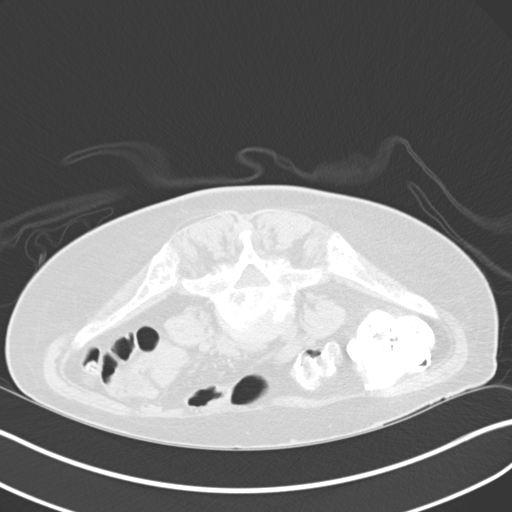
[im 28/31  lung]
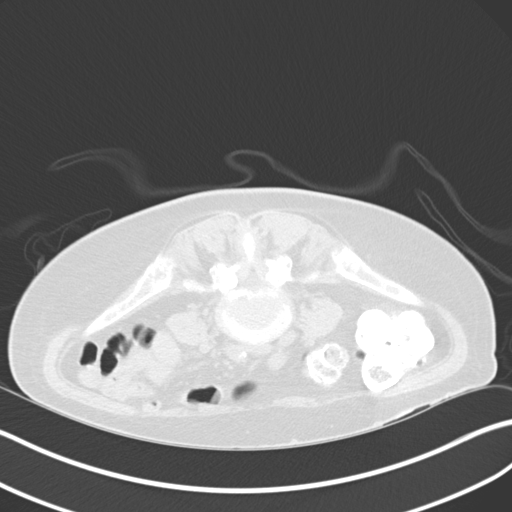
[im 29/31  soft-tissue]
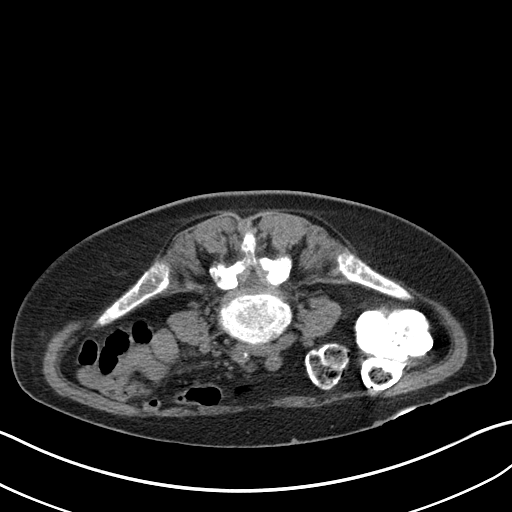
[im 29/31  lung]
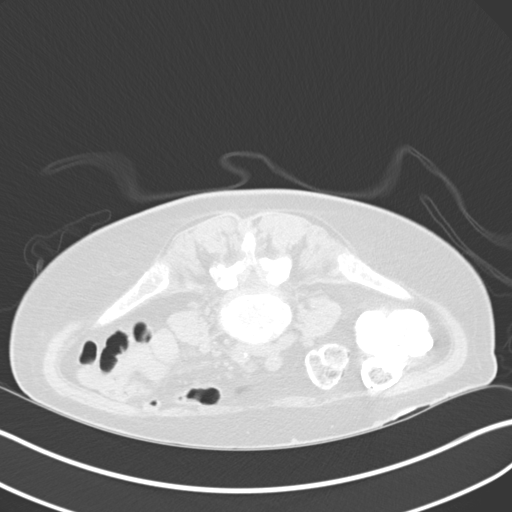

[15 of 32 positions shown; findings below may reference images not displayed]

EXAM:
CT GUIDED RIGHT ILIAC BONE MARROW ASPIRATION AND CORE BIOPSY

Radiologist:  Cathan, Dilen

Guidance:  CT

MEDICATIONS AND MEDICAL HISTORY:
VERSED AND FENTANYL FOR CONSCIOUS SEDATION

ANESTHESIA/SEDATION:
15 MIN

CONTRAST:  NONE.

PROCEDURE:
Informed consent was obtained from the patient following explanation
of the procedure, risks, benefits and alternatives. The patient
understands, agrees and consents for the procedure. All questions
were addressed. A time out was performed.

The patient was positioned prone and noncontrast localization CT was
performed of the pelvis to demonstrate the iliac marrow spaces.

Maximal barrier sterile technique utilized including caps, mask,
sterile gowns, sterile gloves, large sterile drape, hand hygiene,
and betadine prep.

Under sterile conditions and local anesthesia, an 11 gauge coaxial
bone biopsy needle was advanced into the RIGHT iliac marrow space.
Needle position was confirmed with CT imaging. Initially, bone
marrow aspiration was attempted however no aspirate could be
obtained, presumed secondary to the infiltrative marrow process.
Next, the 11 gauge outer cannula was utilized to obtain a 2 right
iliac bone marrow core biopsies. Needle was removed. Hemostasis was
obtained with compression. The patient tolerated the procedure well.
Samples were prepared with the cytotechnologist. No immediate
complications.

COMPLICATIONS:
NO IMMEDIATE
IMPRESSION: CT guided right iliac bone marrow core biopsy.

## 2014-10-29 DIAGNOSIS — C7951 Secondary malignant neoplasm of bone: Secondary | ICD-10-CM | POA: Diagnosis not present

## 2014-10-29 DIAGNOSIS — D649 Anemia, unspecified: Secondary | ICD-10-CM | POA: Diagnosis not present

## 2014-10-29 DIAGNOSIS — C50919 Malignant neoplasm of unspecified site of unspecified female breast: Secondary | ICD-10-CM | POA: Diagnosis not present

## 2014-11-26 DIAGNOSIS — C7951 Secondary malignant neoplasm of bone: Secondary | ICD-10-CM

## 2014-11-26 DIAGNOSIS — C50919 Malignant neoplasm of unspecified site of unspecified female breast: Secondary | ICD-10-CM | POA: Diagnosis not present

## 2014-11-26 DIAGNOSIS — D649 Anemia, unspecified: Secondary | ICD-10-CM

## 2014-12-24 DIAGNOSIS — E860 Dehydration: Secondary | ICD-10-CM

## 2014-12-24 DIAGNOSIS — N189 Chronic kidney disease, unspecified: Secondary | ICD-10-CM | POA: Diagnosis not present

## 2014-12-24 DIAGNOSIS — Z170 Estrogen receptor positive status [ER+]: Secondary | ICD-10-CM

## 2014-12-24 DIAGNOSIS — C50919 Malignant neoplasm of unspecified site of unspecified female breast: Secondary | ICD-10-CM | POA: Diagnosis not present

## 2014-12-24 DIAGNOSIS — C7951 Secondary malignant neoplasm of bone: Secondary | ICD-10-CM | POA: Diagnosis not present

## 2014-12-24 DIAGNOSIS — D649 Anemia, unspecified: Secondary | ICD-10-CM | POA: Diagnosis not present

## 2015-01-21 DIAGNOSIS — D649 Anemia, unspecified: Secondary | ICD-10-CM | POA: Diagnosis not present

## 2015-01-21 DIAGNOSIS — C7951 Secondary malignant neoplasm of bone: Secondary | ICD-10-CM | POA: Diagnosis not present

## 2015-01-21 DIAGNOSIS — C50919 Malignant neoplasm of unspecified site of unspecified female breast: Secondary | ICD-10-CM

## 2015-01-24 DIAGNOSIS — R3911 Hesitancy of micturition: Secondary | ICD-10-CM | POA: Diagnosis not present

## 2015-01-24 DIAGNOSIS — R351 Nocturia: Secondary | ICD-10-CM

## 2015-01-24 DIAGNOSIS — R21 Rash and other nonspecific skin eruption: Secondary | ICD-10-CM | POA: Diagnosis not present

## 2015-01-24 DIAGNOSIS — C50919 Malignant neoplasm of unspecified site of unspecified female breast: Secondary | ICD-10-CM | POA: Diagnosis not present

## 2015-01-24 DIAGNOSIS — C7951 Secondary malignant neoplasm of bone: Secondary | ICD-10-CM

## 2015-02-06 DIAGNOSIS — D649 Anemia, unspecified: Secondary | ICD-10-CM

## 2015-02-06 DIAGNOSIS — C7951 Secondary malignant neoplasm of bone: Secondary | ICD-10-CM | POA: Diagnosis not present

## 2015-02-06 DIAGNOSIS — Z170 Estrogen receptor positive status [ER+]: Secondary | ICD-10-CM

## 2015-02-06 DIAGNOSIS — C50919 Malignant neoplasm of unspecified site of unspecified female breast: Secondary | ICD-10-CM

## 2015-02-18 DIAGNOSIS — D649 Anemia, unspecified: Secondary | ICD-10-CM

## 2015-02-18 DIAGNOSIS — C50919 Malignant neoplasm of unspecified site of unspecified female breast: Secondary | ICD-10-CM

## 2015-02-18 DIAGNOSIS — C7951 Secondary malignant neoplasm of bone: Secondary | ICD-10-CM | POA: Diagnosis not present

## 2015-02-18 DIAGNOSIS — C770 Secondary and unspecified malignant neoplasm of lymph nodes of head, face and neck: Secondary | ICD-10-CM | POA: Diagnosis not present

## 2015-02-18 DIAGNOSIS — E860 Dehydration: Secondary | ICD-10-CM

## 2015-03-18 DIAGNOSIS — E860 Dehydration: Secondary | ICD-10-CM

## 2015-03-18 DIAGNOSIS — C50919 Malignant neoplasm of unspecified site of unspecified female breast: Secondary | ICD-10-CM | POA: Diagnosis not present

## 2015-03-18 DIAGNOSIS — D649 Anemia, unspecified: Secondary | ICD-10-CM | POA: Diagnosis not present

## 2015-03-18 DIAGNOSIS — N189 Chronic kidney disease, unspecified: Secondary | ICD-10-CM | POA: Diagnosis not present

## 2015-03-18 DIAGNOSIS — C7951 Secondary malignant neoplasm of bone: Secondary | ICD-10-CM | POA: Diagnosis not present

## 2015-04-01 DIAGNOSIS — R51 Headache: Secondary | ICD-10-CM | POA: Diagnosis not present

## 2015-04-01 DIAGNOSIS — C50919 Malignant neoplasm of unspecified site of unspecified female breast: Secondary | ICD-10-CM | POA: Diagnosis not present

## 2015-04-01 DIAGNOSIS — C7951 Secondary malignant neoplasm of bone: Secondary | ICD-10-CM | POA: Diagnosis not present

## 2015-04-01 DIAGNOSIS — D649 Anemia, unspecified: Secondary | ICD-10-CM | POA: Diagnosis not present

## 2015-04-01 DIAGNOSIS — R5383 Other fatigue: Secondary | ICD-10-CM

## 2015-04-15 DIAGNOSIS — Z170 Estrogen receptor positive status [ER+]: Secondary | ICD-10-CM

## 2015-04-15 DIAGNOSIS — D649 Anemia, unspecified: Secondary | ICD-10-CM

## 2015-04-15 DIAGNOSIS — N189 Chronic kidney disease, unspecified: Secondary | ICD-10-CM

## 2015-04-15 DIAGNOSIS — C7951 Secondary malignant neoplasm of bone: Secondary | ICD-10-CM

## 2015-04-24 DIAGNOSIS — C50919 Malignant neoplasm of unspecified site of unspecified female breast: Secondary | ICD-10-CM

## 2015-04-24 DIAGNOSIS — C7951 Secondary malignant neoplasm of bone: Secondary | ICD-10-CM

## 2015-04-24 DIAGNOSIS — Z170 Estrogen receptor positive status [ER+]: Secondary | ICD-10-CM | POA: Diagnosis not present

## 2015-04-24 DIAGNOSIS — D509 Iron deficiency anemia, unspecified: Secondary | ICD-10-CM | POA: Diagnosis not present

## 2015-05-07 DIAGNOSIS — Z853 Personal history of malignant neoplasm of breast: Secondary | ICD-10-CM | POA: Diagnosis not present

## 2015-05-07 DIAGNOSIS — E860 Dehydration: Secondary | ICD-10-CM | POA: Diagnosis not present

## 2015-05-07 DIAGNOSIS — C7951 Secondary malignant neoplasm of bone: Secondary | ICD-10-CM

## 2015-05-21 DIAGNOSIS — C7951 Secondary malignant neoplasm of bone: Secondary | ICD-10-CM

## 2015-05-21 DIAGNOSIS — C50919 Malignant neoplasm of unspecified site of unspecified female breast: Secondary | ICD-10-CM

## 2015-06-03 DIAGNOSIS — C50919 Malignant neoplasm of unspecified site of unspecified female breast: Secondary | ICD-10-CM | POA: Diagnosis not present

## 2015-06-18 DIAGNOSIS — C50919 Malignant neoplasm of unspecified site of unspecified female breast: Secondary | ICD-10-CM

## 2015-06-18 DIAGNOSIS — C7951 Secondary malignant neoplasm of bone: Secondary | ICD-10-CM

## 2015-07-01 DIAGNOSIS — C7951 Secondary malignant neoplasm of bone: Secondary | ICD-10-CM | POA: Diagnosis not present

## 2015-07-01 DIAGNOSIS — Z79811 Long term (current) use of aromatase inhibitors: Secondary | ICD-10-CM

## 2015-07-01 DIAGNOSIS — C50919 Malignant neoplasm of unspecified site of unspecified female breast: Secondary | ICD-10-CM

## 2015-07-16 DIAGNOSIS — C50919 Malignant neoplasm of unspecified site of unspecified female breast: Secondary | ICD-10-CM

## 2015-07-30 DIAGNOSIS — C50919 Malignant neoplasm of unspecified site of unspecified female breast: Secondary | ICD-10-CM | POA: Diagnosis not present

## 2015-08-13 DIAGNOSIS — C7951 Secondary malignant neoplasm of bone: Secondary | ICD-10-CM

## 2015-08-13 DIAGNOSIS — C50919 Malignant neoplasm of unspecified site of unspecified female breast: Secondary | ICD-10-CM | POA: Diagnosis not present

## 2015-09-11 DIAGNOSIS — N189 Chronic kidney disease, unspecified: Secondary | ICD-10-CM

## 2015-09-11 DIAGNOSIS — Z853 Personal history of malignant neoplasm of breast: Secondary | ICD-10-CM

## 2015-09-11 DIAGNOSIS — C7951 Secondary malignant neoplasm of bone: Secondary | ICD-10-CM | POA: Diagnosis not present

## 2015-09-11 DIAGNOSIS — R945 Abnormal results of liver function studies: Secondary | ICD-10-CM

## 2015-09-11 DIAGNOSIS — D696 Thrombocytopenia, unspecified: Secondary | ICD-10-CM

## 2015-09-11 DIAGNOSIS — D649 Anemia, unspecified: Secondary | ICD-10-CM

## 2015-09-17 DIAGNOSIS — C50911 Malignant neoplasm of unspecified site of right female breast: Secondary | ICD-10-CM | POA: Diagnosis not present

## 2015-09-17 DIAGNOSIS — C7951 Secondary malignant neoplasm of bone: Secondary | ICD-10-CM | POA: Diagnosis not present

## 2015-09-29 DIAGNOSIS — C50919 Malignant neoplasm of unspecified site of unspecified female breast: Secondary | ICD-10-CM

## 2015-09-29 DIAGNOSIS — C7951 Secondary malignant neoplasm of bone: Secondary | ICD-10-CM

## 2015-09-29 DIAGNOSIS — R55 Syncope and collapse: Secondary | ICD-10-CM

## 2015-10-09 DIAGNOSIS — K649 Unspecified hemorrhoids: Secondary | ICD-10-CM

## 2015-10-09 DIAGNOSIS — C7951 Secondary malignant neoplasm of bone: Secondary | ICD-10-CM | POA: Diagnosis not present

## 2015-10-09 DIAGNOSIS — C50919 Malignant neoplasm of unspecified site of unspecified female breast: Secondary | ICD-10-CM | POA: Diagnosis not present

## 2015-10-09 DIAGNOSIS — D649 Anemia, unspecified: Secondary | ICD-10-CM

## 2015-11-19 DEATH — deceased
